# Patient Record
Sex: Female | Born: 1995 | Hispanic: Yes | Marital: Married | Smoking: Never smoker
Health system: Southern US, Community
[De-identification: ages and names within clinical notes are randomized; demographics above are authoritative.]

## PROBLEM LIST (undated history)

## (undated) ENCOUNTER — Inpatient Hospital Stay (HOSPITAL_COMMUNITY): Payer: Self-pay

## (undated) DIAGNOSIS — N390 Urinary tract infection, site not specified: Secondary | ICD-10-CM

## (undated) DIAGNOSIS — Z789 Other specified health status: Secondary | ICD-10-CM

## (undated) HISTORY — PX: NO PAST SURGERIES: SHX2092

---

## 2016-01-21 ENCOUNTER — Encounter (HOSPITAL_COMMUNITY): Payer: Self-pay | Admitting: *Deleted

## 2016-01-21 ENCOUNTER — Inpatient Hospital Stay (HOSPITAL_COMMUNITY): Payer: Medicaid Other

## 2016-01-21 ENCOUNTER — Inpatient Hospital Stay (HOSPITAL_COMMUNITY)
Admission: AD | Admit: 2016-01-21 | Discharge: 2016-01-21 | Disposition: A | Discharge status: Home / Self Care | Payer: Medicaid Other | Source: Ambulatory Visit | Attending: Family Medicine | Admitting: Family Medicine

## 2016-01-21 DIAGNOSIS — O209 Hemorrhage in early pregnancy, unspecified: Secondary | ICD-10-CM

## 2016-01-21 DIAGNOSIS — O3680X0 Pregnancy with inconclusive fetal viability, not applicable or unspecified: Secondary | ICD-10-CM

## 2016-01-21 DIAGNOSIS — Z3A09 9 weeks gestation of pregnancy: Secondary | ICD-10-CM | POA: Diagnosis not present

## 2016-01-21 HISTORY — DX: Other specified health status: Z78.9

## 2016-01-21 LAB — WET PREP, GENITAL
SPERM: NONE SEEN
Trich, Wet Prep: NONE SEEN
Yeast Wet Prep HPF POC: NONE SEEN

## 2016-01-21 LAB — URINALYSIS, ROUTINE W REFLEX MICROSCOPIC
Bilirubin Urine: NEGATIVE
Glucose, UA: NEGATIVE mg/dL
KETONES UR: NEGATIVE mg/dL
LEUKOCYTES UA: NEGATIVE
NITRITE: NEGATIVE
PROTEIN: NEGATIVE mg/dL
Specific Gravity, Urine: 1.01 (ref 1.005–1.030)
pH: 7 (ref 5.0–8.0)

## 2016-01-21 LAB — CBC WITH DIFFERENTIAL/PLATELET
BASOS ABS: 0 10*3/uL (ref 0.0–0.1)
BASOS PCT: 0 %
EOS ABS: 0.1 10*3/uL (ref 0.0–0.7)
EOS PCT: 2 %
HCT: 38.5 % (ref 36.0–46.0)
HEMOGLOBIN: 13.5 g/dL (ref 12.0–15.0)
LYMPHS ABS: 1.7 10*3/uL (ref 0.7–4.0)
Lymphocytes Relative: 26 %
MCH: 30.1 pg (ref 26.0–34.0)
MCHC: 35.1 g/dL (ref 30.0–36.0)
MCV: 85.7 fL (ref 78.0–100.0)
Monocytes Absolute: 0.4 10*3/uL (ref 0.1–1.0)
Monocytes Relative: 7 %
NEUTROS PCT: 65 %
Neutro Abs: 4.2 10*3/uL (ref 1.7–7.7)
PLATELETS: 172 10*3/uL (ref 150–400)
RBC: 4.49 MIL/uL (ref 3.87–5.11)
RDW: 12.7 % (ref 11.5–15.5)
WBC: 6.4 10*3/uL (ref 4.0–10.5)

## 2016-01-21 LAB — URINE MICROSCOPIC-ADD ON: RBC / HPF: NONE SEEN RBC/hpf (ref 0–5)

## 2016-01-21 LAB — POCT PREGNANCY, URINE: PREG TEST UR: POSITIVE — AB

## 2016-01-21 LAB — HCG, QUANTITATIVE, PREGNANCY: HCG, BETA CHAIN, QUANT, S: 18201 m[IU]/mL — AB (ref <5)

## 2016-01-21 MED ORDER — METRONIDAZOLE 500 MG PO TABS: 500.0000 mg | ORAL_TABLET | Freq: Two times a day (BID) | ORAL | Status: DC

## 2016-01-21 NOTE — Discharge Instructions (Signed)
Primer trimestre de embarazo (First Trimester of Pregnancy) El primer trimestre de embarazo se extiende desde la semana1 hasta el final de la semana12 (mes1 al mes3). Durante este tiempo, el beb comenzar a desarrollarse dentro suyo. Entre la semana6 y la8, se forman los ojos y el rostro, y los latidos del corazn pueden verse en la ecografa. Al final de las 12semanas, todos los rganos del beb estn formados. La atencin prenatal es toda la asistencia mdica que usted recibe antes del nacimiento del beb. Asegrese de recibir una buena atencin prenatal y de seguir todas las indicaciones del mdico. CUIDADOS EN EL HOGAR  Medicamentos:  Tome los medicamentos solamente como se lo haya indicado el mdico. Algunos medicamentos se pueden tomar durante el embarazo y otros no.  Tome las vitaminas prenatales como se lo haya indicado el mdico.  Tome el medicamento que la ayuda a defecar (laxante suave) segn sea necesario, si el mdico lo autoriza. Dieta  Ingiera alimentos saludables de manera regular.  El mdico le indicar la cantidad de peso que puede aumentar.  No coma carne cruda ni quesos sin cocinar.  Si tiene malestar estomacal (nuseas) o vomita:  Ingiera 4 o 5comidas pequeas por da en lugar de 3abundantes.  Intente comer algunas galletitas saladas.  Beba lquidos entre las comidas, en lugar de hacerlo durante estas.  Si tiene dificultad para defecar (estreimiento):  Consuma alimentos con alto contenido de fibra, como verduras y frutas frescos, y cereales integrales.  Beba suficiente lquido para mantener el pis (orina) claro o de color amarillo plido. Actividad y ejercicios  Haga ejercicios solamente como se lo haya indicado el mdico. Deje de hacer ejercicios si tiene clicos o dolor en la parte baja del vientre (abdomen) o en la cintura.  Intente no estar de pie durante mucho tiempo. Mueva las piernas con frecuencia si debe estar de pie en un lugar durante  mucho tiempo.  Evite levantar pesos excesivos.  Use zapatos con tacones bajos. Mantenga una buena postura al sentarse y pararse.  Puede tener relaciones sexuales, a menos que el mdico le indique lo contrario. Alivio del dolor o las molestias  Use un sostn que le brinde buen soporte si le duelen las mamas.  Dese baos con agua tibia (baos de asiento) para aliviar el dolor o las molestias a causa de las hemorroides. Use crema antihemorroidal si el mdico se lo permite.  Descanse con las piernas elevadas si tiene calambres o dolor de cintura.  Use medias de descanso si tiene las venas de las piernas hinchadas y abultadas (venas varicosas). Eleve los pies durante 15minutos, 3 o 4veces por da. Limite la cantidad de sal en su dieta. Cuidados prenatales  Programe las visitas prenatales para la semana12 de embarazo.  Escriba sus preguntas. Llvelas cuando concurra a las visitas prenatales.  Concurra a todas las visitas prenatales como se lo haya indicado el mdico. Seguridad  Colquese el cinturn de seguridad cuando conduzca.  Haga una lista con los nmeros de telfono en caso de emergencia, en la cual deben incluirse los nmeros de los familiares, los amigos, el hospital y los departamentos de polica y de bomberos. Consejos generales  Pdale al mdico que la derive a clases prenatales en su localidad. Debe comenzar a tomar las clases antes de entrar en el mes6 de embarazo.  Pida ayuda si necesita asesoramiento o asistencia con la alimentacin. El mdico puede aconsejarla o indicarle dnde recurrir para recibir ayuda.  No se d baos de inmersin en agua caliente,   baños turcos ni saunas. °· No se haga duchas vaginales ni use tampones o toallas higiénicas perfumadas. °· No mantenga las piernas cruzadas durante mucho tiempo. °· Evite el contacto con las bandejas sanitarias de los gatos y la tierra que estos animales usan. °· No fume, no consuma hierbas ni beba alcohol. No tome  fármacos que el médico no haya autorizado. °· No consuma ningún producto que contenga tabaco, lo que incluye cigarrillos, tabaco de mascar o cigarrillos electrónicos. Si necesita ayuda para dejar de fumar, consulte al médico. Puede recibir asesoramiento u otro tipo de apoyo para dejar de fumar. °· Visite al dentista. En su casa, lávese los dientes con un cepillo dental suave. Pásese el hilo dental con suavidad. °SOLICITE AYUDA SI: °· Tiene mareos. °· Tiene cólicos leves o siente presión en la parte baja del vientre. °· Siente un dolor persistente en la zona del vientre. °· Sigue teniendo malestar estomacal, vomita o las heces son líquidas (diarrea). °· Observa una secreción, con mal olor que proviene de la vagina. °· Siente dolor al orinar. °· Tiene el rostro, las manos, las piernas o los tobillos más hinchados (inflamados). °SOLICITE AYUDA DE INMEDIATO SI:  °· Tiene fiebre. °· Tiene una pérdida de líquido por la vagina. °· Tiene sangrado o pequeñas pérdidas vaginales. °· Tiene cólicos o dolor muy intensos en el vientre. °· Sube o baja de peso rápidamente. °· Vomita sangre. Puede ser similar a la borra del café °· Está en contacto con personas que tienen rubéola, la quinta enfermedad o varicela. °· Siente un dolor de cabeza muy intenso. °· Le falta el aire. °· Sufre cualquier tipo de traumatismo, por ejemplo, debido a una caída o un accidente automovilístico. °  °Esta información no tiene como fin reemplazar el consejo del médico. Asegúrese de hacerle al médico cualquier pregunta que tenga. °  °Document Released: 12/12/2008 Document Revised: 10/06/2014 °Elsevier Interactive Patient Education ©2016 [employer] ° °

## 2016-01-21 NOTE — MAU Provider Note (Signed)
History     CSN: [id number]  Arrival date and time: 01/21/16 4098   First Provider Initiated Contact with Patient 01/21/16 2226      Chief Complaint  Patient presents with  . Vaginal Bleeding   HPI [name] is a 20 y.o. G1P0 at [redacted]w[redacted]d who presents to MAU today with complaint of vaginal bleeding. The patient states LMP of 11/13/15 and then spotting with cramping again around mid-March, after she had +HPT. She states this episode of bleeding started on Saturday. It has been light. She has passed 2 small clots. She denies pain, vaginal discharge, fever, UTI symptoms or N/V/D. Last sex was 1.5 weeks ago.   OB History    Gravida Para Term Preterm AB TAB SAB Ectopic Multiple Living   1               Past Medical History  Diagnosis Date  . Medical history non-contributory     History reviewed. No pertinent past surgical history.  History reviewed. No pertinent family history.  Social History  Substance Use Topics  . Smoking status: Never Smoker   . Smokeless tobacco: None  . Alcohol Use: No    Allergies: No Known Allergies  No prescriptions prior to admission    Review of Systems  Constitutional: Negative for fever and malaise/fatigue.  Gastrointestinal: Negative for nausea, vomiting, abdominal pain, diarrhea and constipation.  Genitourinary: Negative for dysuria, urgency and frequency.       + bleeding Neg - discharge   Physical Exam   Blood pressure 114/62, pulse 76, temperature 98.5 F (36.9 C), resp. rate 18, height  (1.651 m), weight 105 lb (47.628 kg), last menstrual period 11/13/2015.  Physical Exam  Nursing note and vitals reviewed. Constitutional: She is oriented to person, place, and time. She appears well-developed and well-nourished. No distress.  HENT:  Head: Normocephalic and atraumatic.  Cardiovascular: Normal rate.   Respiratory: Effort normal.  GI: Soft. She exhibits no distension and no mass. There is no tenderness. There is no  rebound and no guarding.  Neurological: She is alert and oriented to person, place, and time.  Skin: Skin is warm and dry. No erythema.  Psychiatric: She has a normal mood and affect.    Results for orders placed or performed during the hospital encounter of 01/21/16 (from the past 24 hour(s))  ABO/Rh     Status: None (Preliminary result)   Collection Time: 01/21/16  8:34 PM  Result Value Ref Range   ABO/RH(D) B POS   CBC with Differential     Status: None   Collection Time: 01/21/16  8:34 PM  Result Value Ref Range   WBC 6.4 4.0 - 10.5 K/uL   RBC 4.49 3.87 - 5.11 MIL/uL   Hemoglobin 13.5 12.0 - 15.0 g/dL   HCT 11.9 14.7 - 82.9 %   MCV 85.7 78.0 - 100.0 fL   MCH 30.1 26.0 - 34.0 pg   MCHC 35.1 30.0 - 36.0 g/dL   RDW 56.2 13.0 - 86.5 %   Platelets 172 150 - 400 K/uL   Neutrophils Relative % 65 %   Neutro Abs 4.2 1.7 - 7.7 K/uL   Lymphocytes Relative 26 %   Lymphs Abs 1.7 0.7 - 4.0 K/uL   Monocytes Relative 7 %   Monocytes Absolute 0.4 0.1 - 1.0 K/uL   Eosinophils Relative 2 %   Eosinophils Absolute 0.1 0.0 - 0.7 K/uL   Basophils Relative 0 %   Basophils Absolute 0.0  0.0 - 0.1 K/uL  hCG, quantitative, pregnancy     Status: Abnormal   Collection Time: 01/21/16  8:34 PM  Result Value Ref Range   hCG, Beta Chain, Quant, S 18201 (H) <5 mIU/mL  Urinalysis, Routine w reflex microscopic ([facility])     Status: Abnormal   Collection Time: 01/21/16  8:45 PM  Result Value Ref Range   Color, Urine YELLOW YELLOW   APPearance CLEAR CLEAR   Specific Gravity, Urine 1.010 1.005 - 1.030   pH 7.0 5.0 - 8.0   Glucose, UA NEGATIVE NEGATIVE mg/dL   Hgb urine dipstick SMALL (A) NEGATIVE   Bilirubin Urine NEGATIVE NEGATIVE   Ketones, ur NEGATIVE NEGATIVE mg/dL   Protein, ur NEGATIVE NEGATIVE mg/dL   Nitrite NEGATIVE NEGATIVE   Leukocytes, UA NEGATIVE NEGATIVE  Urine microscopic-add on     Status: Abnormal   Collection Time: 01/21/16  8:45 PM  Result Value Ref Range   Squamous  Epithelial / LPF 0-5 (A) NONE SEEN   WBC, UA 0-5 0 - 5 WBC/hpf   RBC / HPF NONE SEEN 0 - 5 RBC/hpf   Bacteria, UA RARE (A) NONE SEEN   Urine-Other MUCOUS PRESENT   Pregnancy, urine POC     Status: Abnormal   Collection Time: 01/21/16  9:19 PM  Result Value Ref Range   Preg Test, Ur POSITIVE (A) NEGATIVE  Wet prep, genital     Status: Abnormal   Collection Time: 01/21/16 10:35 PM  Result Value Ref Range   Yeast Wet Prep HPF POC NONE SEEN NONE SEEN   Trich, Wet Prep NONE SEEN NONE SEEN   Clue Cells Wet Prep HPF POC PRESENT (A) NONE SEEN   WBC, Wet Prep HPF POC FEW (A) NONE SEEN   Sperm NONE SEEN    [place] Ob Comp Less 14 Wks  01/21/2016  CLINICAL DATA:  Acute onset of vaginal bleeding.  Initial encounter. EXAM: OBSTETRIC <14 WK US AND TRANSVAGINAL OB US TECHNIQUE: Both transabdominal and transvaginal ultrasound examinations were performed for complete evaluation of the gestation as well as the maternal uterus, adnexal regions, and pelvic cul-de-sac. Transvaginal technique was performed to assess early pregnancy. COMPARISON:  None. FINDINGS: Intrauterine gestational sac: There is an elongated collection of fluid at the endometrial echo complex, which could reflect a gestational sac or possibly simply a small amount of fluid. Yolk sac:  No Embryo:  No MSD: 9.3  mm   5 w   5  d Subchorionic hemorrhage:  None visualized. Maternal uterus/adnexae: The uterus is otherwise unremarkable in appearance. The right ovary is not visualized due to overlying bowel gas. The left ovary is unremarkable in appearance, measuring 2.4 x 1.6 x 0.8 cm. There is no evidence for ectopic pregnancy. No free fluid is within the pelvic cul-de-sac. IMPRESSION: Question of intrauterine gestational sac measuring 9 mm in mean sac diameter, though this could simply reflect a small amount of fluid within the endometrial echo complex. This would correspond to a gestational age of [redacted] weeks 5 days, which does not match the gestational age by  LMP. It remains too early to determine an estimated date of delivery. If the patient's quantitative beta HCG level continues to trend upward, follow-up pelvic ultrasound could be performed in 2 weeks for further evaluation. Electronically Signed   By: Roanna RaiderJeffery  Chang M.D.   On: 01/21/2016 22:25   [place] Ob Transvaginal  01/21/2016  CLINICAL DATA:  Acute onset of vaginal bleeding.  Initial encounter. EXAM: OBSTETRIC <  14 WK [place] AND TRANSVAGINAL OB US TECHNIQUE: Both transabdominal and transvaginal ultrasound examinations were performed for complete evaluation of the gestation as well as the maternal uterus, adnexal regions, and pelvic cul-de-sac. Transvaginal technique was performed to assess early pregnancy. COMPARISON:  None. FINDINGS: Intrauterine gestational sac: There is an elongated collection of fluid at the endometrial echo complex, which could reflect a gestational sac or possibly simply a small amount of fluid. Yolk sac:  No Embryo:  No MSD: 9.3  mm   5 w   5  d Subchorionic hemorrhage:  None visualized. Maternal uterus/adnexae: The uterus is otherwise unremarkable in appearance. The right ovary is not visualized due to overlying bowel gas. The left ovary is unremarkable in appearance, measuring 2.4 x 1.6 x 0.8 cm. There is no evidence for ectopic pregnancy. No free fluid is within the pelvic cul-de-sac. IMPRESSION: Question of intrauterine gestational sac measuring 9 mm in mean sac diameter, though this could simply reflect a small amount of fluid within the endometrial echo complex. This would correspond to a gestational age of [redacted] weeks 5 days, which does not match the gestational age by LMP. It remains too early to determine an estimated date of delivery. If the patient's quantitative beta HCG level continues to trend upward, follow-up pelvic ultrasound could be performed in 2 weeks for further evaluation. Electronically Signed   By: Roanna Raider M.D.   On: 01/21/2016 22:25    MAU Course   Procedures None  MDM +UPT UA, wet prep, GC/chlamydia, CBC, ABO/Rh, quant hCG, HIV, RPR and [place] today to rule out ectopic pregnancy Discussed patient with Dr. Adrian Blackwater. Since patient is not having pain and only light bleeding today will have patient follow-up in 48 hours in [facility] for repeat labs with strict ectopic precautions.  Assessment and Plan  A: Pregnancy of unknown location Vaginal bleeding in pregnancy  P: Discharge home Ectopic/bleeding precautions discussed Patient advised to follow-up with [facility] on Thursday at 11:00 am Patient may return to MAU as needed or if her condition were to change or worsen   Marny Lowenstein, PA-C  01/21/2016, 11:19 PM

## 2016-01-21 NOTE — MAU Note (Signed)
Pt [facility]PT [facility] wks ago when she had brown spotting. Today she started with red bleeding and two very sm clots followed by some mild upper abd pain. Pt dizzy while sitting in triage.

## 2016-01-22 ENCOUNTER — Telehealth: Payer: Self-pay | Admitting: *Deleted

## 2016-01-22 LAB — GC/CHLAMYDIA PROBE AMP (~~LOC~~) NOT AT ARMC
Chlamydia: NEGATIVE
NEISSERIA GONORRHEA: NEGATIVE

## 2016-01-22 LAB — ABO/RH: ABO/RH(D): B POS

## 2016-01-22 MED ORDER — METRONIDAZOLE 500 MG PO TABS: 500.0000 mg | ORAL_TABLET | Freq: Two times a day (BID) | ORAL | Status: DC

## 2016-01-22 NOTE — Telephone Encounter (Signed)
Pt didn't get rx for flagyl. Sent to pharmacy.

## 2016-01-24 ENCOUNTER — Other Ambulatory Visit: Payer: Self-pay

## 2016-01-24 DIAGNOSIS — O3680X0 Pregnancy with inconclusive fetal viability, not applicable or unspecified: Secondary | ICD-10-CM

## 2016-01-24 NOTE — Progress Notes (Unsigned)
Pt in for repeat hcg level. Denies pain, has a small amount of bleeding. Dr. Macon LargeAnyanwu reviewed ultrasound and labs and stated that patient doesn't repeat hcg only repeat ultrasound next week. Patient is agreeable to this.

## 2016-01-25 ENCOUNTER — Inpatient Hospital Stay (HOSPITAL_COMMUNITY): Payer: Medicaid Other

## 2016-01-25 ENCOUNTER — Encounter (HOSPITAL_COMMUNITY): Payer: Self-pay

## 2016-01-25 ENCOUNTER — Inpatient Hospital Stay (HOSPITAL_COMMUNITY)
Admission: AD | Admit: 2016-01-25 | Discharge: 2016-01-26 | Disposition: A | Discharge status: Home / Self Care | Payer: Medicaid Other | Source: Ambulatory Visit | Attending: Obstetrics and Gynecology | Admitting: Obstetrics and Gynecology

## 2016-01-25 DIAGNOSIS — Z3A10 10 weeks gestation of pregnancy: Secondary | ICD-10-CM | POA: Diagnosis not present

## 2016-01-25 DIAGNOSIS — R1084 Generalized abdominal pain: Secondary | ICD-10-CM | POA: Diagnosis present

## 2016-01-25 DIAGNOSIS — O039 Complete or unspecified spontaneous abortion without complication: Secondary | ICD-10-CM | POA: Diagnosis not present

## 2016-01-25 DIAGNOSIS — O209 Hemorrhage in early pregnancy, unspecified: Secondary | ICD-10-CM | POA: Diagnosis not present

## 2016-01-25 DIAGNOSIS — O034 Incomplete spontaneous abortion without complication: Secondary | ICD-10-CM

## 2016-01-25 DIAGNOSIS — N939 Abnormal uterine and vaginal bleeding, unspecified: Secondary | ICD-10-CM | POA: Diagnosis present

## 2016-01-25 LAB — CBC
HCT: 36.1 % (ref 36.0–46.0)
HEMOGLOBIN: 12.8 g/dL (ref 12.0–15.0)
MCH: 30.1 pg (ref 26.0–34.0)
MCHC: 35.5 g/dL (ref 30.0–36.0)
MCV: 84.9 fL (ref 78.0–100.0)
PLATELETS: 149 10*3/uL — AB (ref 150–400)
RBC: 4.25 MIL/uL (ref 3.87–5.11)
RDW: 12.6 % (ref 11.5–15.5)
WBC: 7.5 10*3/uL (ref 4.0–10.5)

## 2016-01-25 NOTE — MAU Provider Note (Signed)
History     CSN: [id number]  Arrival date and time: 01/25/16 2243   None     Chief Complaint  Patient presents with  . Vaginal Bleeding  . Abdominal Pain   HPI   [name] is a 20 y.o. female G1P0 at 5917w3d presenting with vaginal bleeding and abdominal pain. The bleeding started a few days ago and she was seen for this in MAU. She was scheduled to go for repeat blood work on Thursday however when she arrived they told her they were going to change her appointment to next Wednesday to have an US.  The pain started having pain in her lower abdomen today and heavier bleeding, so the patient returned to MAU.  She rates her pain 9/10, she has not taken anything for the pain.   OB History    Gravida Para Term Preterm AB TAB SAB Ectopic Multiple Living   1               Past Medical History  Diagnosis Date  . Medical history non-contributory     History reviewed. No pertinent past surgical history.  History reviewed. No pertinent family history.  Social History  Substance Use Topics  . Smoking status: Never Smoker   . Smokeless tobacco: None  . Alcohol Use: No    Allergies: No Known Allergies  Prescriptions prior to admission  Medication Sig Dispense Refill Last Dose  . metroNIDAZOLE (FLAGYL) 500 MG tablet Take 1 tablet (500 mg total) by mouth 2 (two) times daily. 14 tablet 0   . prenatal vitamin w/FE, FA (PRENATAL 1 + 1) 27-1 MG TABS tablet Take 1 tablet by mouth daily at 12 noon.   01/21/2016 at Unknown time   Results for orders placed or performed during the hospital encounter of 01/25/16 (from the past 48 hour(s))  hCG, quantitative, pregnancy     Status: Abnormal   Collection Time: 01/25/16 11:34 PM  Result Value Ref Range   hCG, Beta Chain, Quant, S 9272 (H) <5 mIU/mL    Comment:          GEST. AGE      CONC.  (mIU/mL)   <=1 WEEK        5 - 50     2 WEEKS       50 - 500     3 WEEKS       100 - 10,000     4 WEEKS     1,000 - 30,000     5 WEEKS      3,500 - 115,000   6-8 WEEKS     12,000 - 270,000    12 WEEKS     15,000 - 220,000        FEMALE AND NON-PREGNANT FEMALE:     LESS THAN 5 mIU/mL   CBC     Status: Abnormal   Collection Time: 01/25/16 11:34 PM  Result Value Ref Range   WBC 7.5 4.0 - 10.5 K/uL   RBC 4.25 3.87 - 5.11 MIL/uL   Hemoglobin 12.8 12.0 - 15.0 g/dL   HCT 47.836.1 29.536.0 - 62.146.0 %   MCV 84.9 78.0 - 100.0 fL   MCH 30.1 26.0 - 34.0 pg   MCHC 35.5 30.0 - 36.0 g/dL   RDW 30.812.6 65.711.5 - 84.615.5 %   Platelets 149 (L) 150 - 400 K/uL   [place] Ob Transvaginal  01/26/2016  CLINICAL DATA:  Acute onset of vaginal bleeding and generalized abdominal pain. Initial encounter.  EXAM: TRANSVAGINAL OB ULTRASOUND TECHNIQUE: Transvaginal ultrasound was performed for complete evaluation of the gestation as well as the maternal uterus, adnexal regions, and pelvic cul-de-sac. COMPARISON:  None. FINDINGS: Intrauterine gestational sac: Seen at the lower uterine segment, elongated and irregular in appearance. Yolk sac:  No Embryo:  No MSD: 11.2  mm   5 w   6  d Subchorionic hemorrhage:  None visualized. Maternal uterus/adnexae: The uterus is otherwise unremarkable. The ovaries are within normal limits. The right ovary measures 2.7 x 1.9 x 2.4 cm, while the left ovary measures 1.9 x 0.7 x 1.4 cm. No suspicious adnexal masses are seen; there is no evidence for ovarian torsion. No free fluid is seen within the pelvic cul-de-sac. IMPRESSION: The patient's intrauterine gestational sac is elongated and irregular, noted at the lower uterine segment, concerning for spontaneous abortion in progress. Electronically Signed   By: Roanna Raider M.D.   On: 01/26/2016 00:10     Review of Systems  Constitutional: Negative for fever and chills.  Gastrointestinal: Positive for abdominal pain. Negative for nausea and vomiting.  Genitourinary: Negative for dysuria and urgency.   Physical Exam   Blood pressure 132/66, pulse 92, temperature 97.4 F (36.3 C), resp. rate 20,  height  (1.651 m), weight 103 lb 12.8 oz (47.083 kg), last menstrual period 11/13/2015.  Physical Exam  Constitutional: She is oriented to person, place, and time. She appears well-developed and well-nourished. No distress.  Respiratory: Effort normal.  GI: Soft. She exhibits no distension. There is no tenderness. There is no rebound and no guarding.  Genitourinary:  Bimanual exam: Cervix closed, anterior, small amount of dark red blood noted on exam glove.   Musculoskeletal: Normal range of motion.  Neurological: She is alert and oriented to person, place, and time.  Skin: Skin is warm. She is not diaphoretic.  Psychiatric: Her behavior is normal.    MAU Course  Procedures  None  MDM Toradol 60 mg IM Vicodin 1 tab CBC Hcg [place]   B positive blood type. Patient and significant very upset with diagnoses of impending SAB. Spanish interpretor used.   Assessment and Plan   A:  1. Inevitable spontaneous abortion   2. Vaginal bleeding in pregnancy, first trimester    P:  Discharge home in stable condition RX: Vicodin, ibuprofen Follow up in the [facility] in 6-7 days; message sent to the Clinic Bleeding precautions Pelvic rest Return to MAU if symptoms worsen   Duane Lope, NP 01/25/2016 1:13 AM

## 2016-01-25 NOTE — MAU Note (Signed)
Spotting since last Sunday. Seen MAU on Monday. Has cont to spot. Tonight having abd cramping and more bleeding

## 2016-01-26 ENCOUNTER — Encounter (HOSPITAL_COMMUNITY): Payer: Self-pay | Admitting: Obstetrics and Gynecology

## 2016-01-26 DIAGNOSIS — O039 Complete or unspecified spontaneous abortion without complication: Secondary | ICD-10-CM

## 2016-01-26 LAB — HCG, QUANTITATIVE, PREGNANCY: hCG, Beta Chain, Quant, S: 9272 m[IU]/mL — ABNORMAL HIGH (ref <5)

## 2016-01-26 MED ORDER — HYDROCODONE-ACETAMINOPHEN 5-325 MG PO TABS: 1.0000 | ORAL_TABLET | ORAL | Status: DC | PRN

## 2016-01-26 MED ORDER — KETOROLAC TROMETHAMINE 60 MG/2ML IM SOLN
60.0000 mg | Freq: Once | INTRAMUSCULAR | Status: AC
  Administered 2016-01-26: 60 mg via INTRAMUSCULAR
  Filled 2016-01-26: qty 2

## 2016-01-26 MED ORDER — IBUPROFEN 600 MG PO TABS: 600.0000 mg | ORAL_TABLET | Freq: Four times a day (QID) | ORAL | Status: DC | PRN

## 2016-01-26 MED ORDER — HYDROCODONE-ACETAMINOPHEN 5-325 MG PO TABS
1.0000 | ORAL_TABLET | Freq: Once | ORAL | Status: AC
  Administered 2016-01-26: 1 via ORAL
  Filled 2016-01-26: qty 1

## 2016-01-26 NOTE — Discharge Instructions (Signed)
Vaginal Bleeding During Pregnancy, First Trimester °A small amount of bleeding (spotting) from the vagina is common in early pregnancy. Sometimes the bleeding is normal and is not a problem, and sometimes it is a sign of something serious. Be sure to tell your doctor about any bleeding from your vagina right away. °HOME CARE °· Watch your condition for any changes. °· Follow your doctor's instructions about how active you can be. °· If you are on bed rest: °· You may need to stay in bed and only get up to use the bathroom. °· You may be allowed to do some activities. °· If you need help, make plans for someone to help you. °· Write down: °· The number of pads you use each day. °· How often you change pads. °· How soaked (saturated) your pads are. °· Do not use tampons. °· Do not douche. °· Do not have sex or orgasms until your doctor says it is okay. °· If you pass any tissue from your vagina, save the tissue so you can show it to your doctor. °· Only take medicines as told by your doctor. °· Do not take aspirin because it can make you bleed. °· Keep all follow-up visits as told by your doctor. °GET HELP IF:  °· You bleed from your vagina. °· You have cramps. °· You have labor pains. °· You have a fever that does not go away after you take medicine. °GET HELP RIGHT AWAY IF:  °· You have very bad cramps in your back or belly (abdomen). °· You pass large clots or tissue from your vagina. °· You bleed more. °· You feel light-headed or weak. °· You pass out (faint). °· You have chills. °· You are leaking fluid or have a gush of fluid from your vagina. °· You pass out while pooping (having a bowel movement). °MAKE SURE YOU: °· Understand these instructions. °· Will watch your condition. °· Will get help right away if you are not doing well or get worse. °  °This information is not intended to replace advice given to you by your health care provider. Make sure you discuss any questions you have with your health care  provider. °  °Document Released: 01/30/2014 Document Reviewed: 01/30/2014 °Elsevier Interactive Patient Education ©2016 Elsevier Inc. ° °Miscarriage °A miscarriage is the sudden loss of an unborn baby (fetus) before the 20th week of pregnancy. Most miscarriages happen in the first 3 months of pregnancy. Sometimes, it happens before a woman even knows she is pregnant. A miscarriage is also called a "spontaneous miscarriage" or "early pregnancy loss." Having a miscarriage can be an emotional experience. Talk with your caregiver about any questions you may have about miscarrying, the grieving process, and your future pregnancy plans. °CAUSES  °· Problems with the fetal chromosomes that make it impossible for the baby to develop normally. Problems with the baby's genes or chromosomes are most often the result of errors that occur, by chance, as the embryo divides and grows. The problems are not inherited from the parents. °· Infection of the cervix or uterus.   °· Hormone problems.   °· Problems with the cervix, such as having an incompetent cervix. This is when the tissue in the cervix is not strong enough to hold the pregnancy.   °· Problems with the uterus, such as an abnormally shaped uterus, uterine fibroids, or congenital abnormalities.   °· Certain medical conditions.   °· Smoking, drinking alcohol, or taking illegal drugs.   °· Trauma.   °Often, the cause   of a miscarriage is unknown.  °SYMPTOMS  °· Vaginal bleeding or spotting, with or without cramps or pain. °· Pain or cramping in the abdomen or lower back. °· Passing fluid, tissue, or blood clots from the vagina. °DIAGNOSIS  °Your caregiver will perform a physical exam. You may also have an ultrasound to confirm the miscarriage. Blood or urine tests may also be ordered. °TREATMENT  °· Sometimes, treatment is not necessary if you naturally pass all the fetal tissue that was in the uterus. If some of the fetus or placenta remains in the body (incomplete  miscarriage), tissue left behind may become infected and must be removed. Usually, a dilation and curettage (D and C) procedure is performed. During a D and C procedure, the cervix is widened (dilated) and any remaining fetal or placental tissue is gently removed from the uterus. °· Antibiotic medicines are prescribed if there is an infection. Other medicines may be given to reduce the size of the uterus (contract) if there is a lot of bleeding. °· If you have Rh negative blood and your baby was Rh positive, you will need a Rh immunoglobulin shot. This shot will protect any future baby from having Rh blood problems in future pregnancies. °HOME CARE INSTRUCTIONS  °· Your caregiver may order bed rest or may allow you to continue light activity. Resume activity as directed by your caregiver. °· Have someone help with home and family responsibilities during this time.   °· Keep track of the number of sanitary pads you use each day and how soaked (saturated) they are. Write down this information.   °· Do not use tampons. Do not douche or have sexual intercourse until approved by your caregiver.   °· Only take over-the-counter or prescription medicines for pain or discomfort as directed by your caregiver.   °· Do not take aspirin. Aspirin can cause bleeding.   °· Keep all follow-up appointments with your caregiver.   °· If you or your partner have problems with grieving, talk to your caregiver or seek counseling to help cope with the pregnancy loss. Allow enough time to grieve before trying to get pregnant again.   °SEEK IMMEDIATE MEDICAL CARE IF:  °· You have severe cramps or pain in your back or abdomen. °· You have a fever. °· You pass large blood clots (walnut-sized or larger) or tissue from your vagina. Save any tissue for your caregiver to inspect.   °· Your bleeding increases.   °· You have a thick, bad-smelling vaginal discharge. °· You become lightheaded, weak, or you faint.   °· You have chills.   °MAKE SURE  YOU: °· Understand these instructions. °· Will watch your condition. °· Will get help right away if you are not doing well or get worse. °  °This information is not intended to replace advice given to you by your health care provider. Make sure you discuss any questions you have with your health care provider. °  °Document Released: 03/11/2001 Document Revised: 01/10/2013 Document Reviewed: 11/04/2011 °Elsevier Interactive Patient Education ©2016 Elsevier Inc. ° °

## 2016-01-26 NOTE — MAU Note (Signed)
Urine collected and sent to lab.

## 2016-01-30 ENCOUNTER — Ambulatory Visit (HOSPITAL_COMMUNITY): Admission: RE | Admit: 2016-01-30 | Payer: Self-pay | Source: Ambulatory Visit

## 2016-02-07 ENCOUNTER — Other Ambulatory Visit: Payer: Self-pay | Admitting: Advanced Practice Midwife

## 2016-02-07 ENCOUNTER — Ambulatory Visit (INDEPENDENT_AMBULATORY_CARE_PROVIDER_SITE_OTHER): Payer: Medicaid Other | Admitting: Advanced Practice Midwife

## 2016-02-07 ENCOUNTER — Encounter: Payer: Self-pay | Admitting: Advanced Practice Midwife

## 2016-02-07 VITALS — BP 121/72 | HR 67 | Temp 97.9°F | Ht 65.0 in | Wt 102.2 lb

## 2016-02-07 DIAGNOSIS — O039 Complete or unspecified spontaneous abortion without complication: Secondary | ICD-10-CM

## 2016-02-07 NOTE — Progress Notes (Signed)
Used Interpreter Darletta Mollorita Arias. Did not complete flagyl due to miscarriage.

## 2016-02-07 NOTE — Progress Notes (Signed)
Subjective:    Emily Finley is a 20 y.o. female. [place] reports She had a spontaneous miscarriage on 01/25/16,  She is not in acute distress. Ectopic risks: none.  Cycle length: regular .  Blood type: B positive. Other lab results: none.  The following portions of the patient's history were reviewed and updated as appropriate: allergies, current medications, past family history, past medical history, past social history, past surgical history and problem list.  Review of Systems Pertinent items are noted in HPI.   Objective:     BP 121/72 mmHg  Pulse 67  Temp(Src) 97.9 F (36.6 C)  Ht  (1.651 m)  Wt 102 lb 3.2 oz (46.358 kg)  BMI 17.01 kg/m2  LMP 11/13/2015 (Exact Date)  Breastfeeding? Unknown General:   alert and cooperative  Heart: regular rate and rhythm, S1, S2 normal, no murmur, click, rub or gallop  Lungs: clear to auscultation bilaterally  Abdomen: soft, non-tender, without masses or organomegaly  Pelvic: Vulva and vagina appear normal. Bimanual exam reveals normal uterus and adnexa.                 Vulva: Bartholin's, Urethra, Skene's normal              Vagina:  normal mucosa, normal discharge, No further bleeding               Cervix: nulliparous appearance               Uterus: normal size              Adnexa: no mass, fullness, tenderness   Imaging    US Ob Comp Less 14 Wks  01/21/2016  CLINICAL DATA:  Acute onset of vaginal bleeding.  Initial encounter. EXAM: OBSTETRIC <14 WK [place] AND TRANSVAGINAL OB US TECHNIQUE: Both transabdominal and transvaginal ultrasound examinations were performed for complete evaluation of the gestation as well as the maternal uterus, adnexal regions, and pelvic cul-de-sac. Transvaginal technique was performed to assess early pregnancy. COMPARISON:  None. FINDINGS: Intrauterine gestational sac: There is an elongated collection of fluid at the endometrial echo complex, which could reflect a gestational sac or possibly simply a  small amount of fluid. Yolk sac:  No Embryo:  No MSD: 9.3  mm   5 w   5  d Subchorionic hemorrhage:  None visualized. Maternal uterus/adnexae: The uterus is otherwise unremarkable in appearance. The right ovary is not visualized due to overlying bowel gas. The left ovary is unremarkable in appearance, measuring 2.4 x 1.6 x 0.8 cm. There is no evidence for ectopic pregnancy. No free fluid is within the pelvic cul-de-sac. IMPRESSION: Question of intrauterine gestational sac measuring 9 mm in mean sac diameter, though this could simply reflect a small amount of fluid within the endometrial echo complex. This would correspond to a gestational age of [redacted] weeks 5 days, which does not match the gestational age by LMP. It remains too early to determine an estimated date of delivery. If the patient's quantitative beta HCG level continues to trend upward, follow-up pelvic ultrasound could be performed in 2 weeks for further evaluation. Electronically Signed   By: Roanna Raider M.D.   On: 01/21/2016 22:25   US Ob Transvaginal  01/26/2016  CLINICAL DATA:  Acute onset of vaginal bleeding and generalized abdominal pain. Initial encounter. EXAM: TRANSVAGINAL OB ULTRASOUND TECHNIQUE: Transvaginal ultrasound was performed for complete evaluation of the gestation as well as the maternal uterus, adnexal regions, and pelvic cul-de-sac. COMPARISON:  None.  FINDINGS: Intrauterine gestational sac: Seen at the lower uterine segment, elongated and irregular in appearance. Yolk sac:  No Embryo:  No MSD: 11.2  mm   5 w   6  d Subchorionic hemorrhage:  None visualized. Maternal uterus/adnexae: The uterus is otherwise unremarkable. The ovaries are within normal limits. The right ovary measures 2.7 x 1.9 x 2.4 cm, while the left ovary measures 1.9 x 0.7 x 1.4 cm. No suspicious adnexal masses are seen; there is no evidence for ovarian torsion. No free fluid is seen within the pelvic cul-de-sac. IMPRESSION: The patient's intrauterine gestational  sac is elongated and irregular, noted at the lower uterine segment, concerning for spontaneous abortion in progress. Electronically Signed   By: Roanna RaiderJeffery  Chang M.D.   On: 01/26/2016 00:10   [place] Ob Transvaginal  01/21/2016  CLINICAL DATA:  Acute onset of vaginal bleeding.  Initial encounter. EXAM: OBSTETRIC <14 WK US AND TRANSVAGINAL OB US TECHNIQUE: Both transabdominal and transvaginal ultrasound examinations were performed for complete evaluation of the gestation as well as the maternal uterus, adnexal regions, and pelvic cul-de-sac. Transvaginal technique was performed to assess early pregnancy. COMPARISON:  None. FINDINGS: Intrauterine gestational sac: There is an elongated collection of fluid at the endometrial echo complex, which could reflect a gestational sac or possibly simply a small amount of fluid. Yolk sac:  No Embryo:  No MSD: 9.3  mm   5 w   5  d Subchorionic hemorrhage:  None visualized. Maternal uterus/adnexae: The uterus is otherwise unremarkable in appearance. The right ovary is not visualized due to overlying bowel gas. The left ovary is unremarkable in appearance, measuring 2.4 x 1.6 x 0.8 cm. There is no evidence for ectopic pregnancy. No free fluid is within the pelvic cul-de-sac. IMPRESSION: Question of intrauterine gestational sac measuring 9 mm in mean sac diameter, though this could simply reflect a small amount of fluid within the endometrial echo complex. This would correspond to a gestational age of [redacted] weeks 5 days, which does not match the gestational age by LMP. It remains too early to determine an estimated date of delivery. If the patient's quantitative beta HCG level continues to trend upward, follow-up pelvic ultrasound could be performed in 2 weeks for further evaluation. Electronically Signed   By: Roanna RaiderJeffery  Chang M.D.   On: 01/21/2016 22:25    Assessment:       S/P Spontaneous abortion on 01/26/16     Plan:     Discussed SAB   Doing well with no further bleeding or  pain   Will check quant HCG today and weekly until 0    Discussed future plans.  Does not plan pregnancy but declines contraception. States her husband "will take care of it". Discussed this method is not always successful and may result in pregnancy. She states that is all right    Interpretor used

## 2016-02-07 NOTE — Patient Instructions (Signed)
Aborto espontáneo  °(Miscarriage) °El aborto espontáneo es la pérdida de un bebé que no ha nacido (feto) antes de la semana 20 del embarazo. La mayor parte de estos abortos ocurre en los primeros 3 meses. En algunos casos ocurre antes de que la mujer sepa que está embarazada. También se denomina "aborto espontáneo" o "pérdida prematura del embarazo". El aborto espontáneo puede ser una experiencia que afecte emocionalmente a la persona. Converse con su médico si tiene dudas, cómo es el proceso de duelo, y sobre planes futuros de embarazo.  °CAUSAS  °· Algunos problemas cromosómicos pueden hacer imposible que el bebé se desarrolle normalmente. Los problemas con los genes o cromosomas del bebé son generalmente el resultado de errores que se producen, por casualidad, cuando el embrión se divide y crece. Estos problemas no se heredan de los padres. °· Infección en el cuello del útero.   °· Problemas hormonales.   °· Problemas en el cuello del útero, como tener un útero incompetente. Esto ocurre cuando los tejidos no son lo suficientemente fuertes como para contener el embarazo.   °· Problemas del útero, como un útero con forma anormal, los fibromas o anormalidades congénitas.   °· Ciertas enfermedades crónicas.   °· No fume, no beba alcohol, ni consuma drogas.   °· Traumatismos   °A veces, la causa es desconocida.  °SÍNTOMAS  °· Sangrado o manchado vaginal, con o sin cólicos o dolor. °· Dolor o cólicos en el abdomen o en la cintura. °· Eliminación de líquido, tejidos o coágulos grandes por la vagina. °DIAGNÓSTICO  °El médico le hará un examen físico. También le indicará una ecografía para confirmar el aborto. Es posible que se realicen análisis de sangre.  °TRATAMIENTO  °· En algunos casos el tratamiento no es necesario, si se eliminan naturalmente todos los tejidos embrionarios que se encontraban en el útero. Si el feto o la placenta quedan dentro del útero (aborto incompleto), pueden infectarse, los tejidos que quedan  pueden infectarse y deben retirarse. Generalmente se realiza un procedimiento de dilatación y curetaje (D y C). Durante el procedimiento de dilatación y curetaje, el cuello del útero se abre (dilata) y se retira cualquier resto de tejido fetal o placentario del útero. °· Si hay una infección, le recetarán antibióticos. Podrán recetarle otros medicamentos para reducir el tamaño del útero (contraerlo) si hay una mucho sangrado. °· Si su sangre es Rh negativa y su bebé es Rh positivo, usted necesitará la inyección de inmunoglobulina Rh. Esta inyección protegerá a los futuros bebés de tener problemas de compatibilidad Rh en futuros embarazos. °INSTRUCCIONES PARA EL CUIDADO EN EL HOGAR  °· El médico le indicará reposo en cama o le permitirá realizar actividades livianas. Vuelva a la actividad lentamente o según las indicaciones de su médico. °· Pídale a alguien que la ayude con las responsabilidades familiares y del hogar durante este tiempo.   °· Lleve un registro de la cantidad y la saturación de las toallas higiénicas que utiliza cada día. Anote esta información   °· No use tampones. No No se haga duchas vaginales ni tenga relaciones sexuales hasta que el médico la autorice.   °· Sólo tome medicamentos de venta libre o recetados para calmar el dolor o el malestar, según las indicaciones de su médico.   °· No tome aspirina. La aspirina puede ocasionar hemorragias.   °· Concurra puntualmente a las citas de control con el médico.   °· Si usted o su pareja tienen dificultades con el duelo, hable con su médico para buscar la ayuda psicológica que los ayude a enfrentar la pérdida   del embarazo. Permítase el tiempo suficiente de duelo antes de quedar embarazada nuevamente.   °SOLICITE ATENCIÓN MÉDICA DE INMEDIATO SI:  °· Siente calambres intensos o dolor en la espalda o en el abdomen. °· Tiene fiebre. °· Elimina grandes coágulos de sangre (del tamaño de una nuez o más) o tejidos por la vagina. Guarde lo que ha eliminado para  que su médico lo examine.   °· La hemorragia aumenta.   °· Observa una secreción vaginal espesa y con mal olor. °· Se siente mareada, débil, o se desmaya.   °· Siente escalofríos.   °ASEGÚRESE DE QUE:  °· Comprende estas instrucciones. °· Controlará su enfermedad. °· Solicitará ayuda de inmediato si no mejora o si empeora. °  °Esta información no tiene como fin reemplazar el consejo del médico. Asegúrese de hacerle al médico cualquier pregunta que tenga. °  °Document Released: 06/25/2005 Document Revised: 01/10/2013 °Elsevier Interactive Patient Education ©2016 Elsevier Inc. ° °

## 2016-02-08 LAB — HCG, QUANTITATIVE, PREGNANCY: hCG, Beta Chain, Quant, S: 82.1 m[IU]/mL — ABNORMAL HIGH

## 2016-02-11 ENCOUNTER — Telehealth: Payer: Self-pay | Admitting: General Practice

## 2016-02-11 NOTE — Telephone Encounter (Signed)
Telephone call to patient regarding bhcg results. Patient should return Friday or early next week for repeat bhcg following SAB. Called patient & her husband answered stating she wasn't in right now but would tell her we called.

## 2016-02-12 LAB — BETA HCG QUANT (REF LAB): BETA HCG, TUMOR MARKER: 115.6 m[IU]/mL — AB (ref <5.0)

## 2016-02-20 NOTE — Telephone Encounter (Signed)
Called patient with Darl PikesSusan for interpreter & informed her of results & recommendations. Patient verbalized understanding & states she can come 5/30 @ 11am. Patient had no questions

## 2016-02-26 ENCOUNTER — Other Ambulatory Visit: Payer: Self-pay

## 2016-02-26 DIAGNOSIS — O039 Complete or unspecified spontaneous abortion without complication: Secondary | ICD-10-CM

## 2016-02-26 LAB — HCG, QUANTITATIVE, PREGNANCY: hCG, Beta Chain, Quant, S: 6.6 m[IU]/mL — ABNORMAL HIGH

## 2016-02-27 ENCOUNTER — Telehealth: Payer: Self-pay | Admitting: General Practice

## 2016-02-27 NOTE — Telephone Encounter (Signed)
Called patient with bhcg results with pac[place] interpreter [phone]. Patient verbalized understanding and states she started bleeding a week ago like a period and is still bleeding. Reassured patient that it is likely her body still processing what is left of the pregnancy and that bleeding should improve soon. Patient verbalized understanding & had no questions

## 2016-09-29 NOTE — L&D Delivery Note (Signed)
Delivery Note After a 1 hour second stage, at 9:31 AM a viable female was delivered via  (Presentation:LOA;  ).  APGAR: 9/9, ; weight  Pending.  After 1 minute, the cord was clamped and cut. 40 units of pitocin diluted in 1000cc LR was infused rapidly IV.  The placenta separated spontaneously and delivered via CCT and maternal pushing effort.  It was inspected and appears to be intact with a 3 VC.   Anesthesia:  local Episiotomy:  none Lacerations:  Tiny 1st degree perineal Suture Repair: 3.0 vicryl Est. Blood Loss (mL):  200 Mom to postpartum.  Baby to Couplet care / Skin to Skin.  CRESENZO-DISHMAN,Arvine Clayburn 03/20/2017, 9:45 AM

## 2016-10-27 LAB — OB RESULTS CONSOLE RUBELLA ANTIBODY, IGM: RUBELLA: IMMUNE

## 2016-10-27 LAB — OB RESULTS CONSOLE VARICELLA ZOSTER ANTIBODY, IGG: Varicella: NON-IMMUNE/NOT IMMUNE

## 2016-10-27 LAB — OB RESULTS CONSOLE GC/CHLAMYDIA
CHLAMYDIA, DNA PROBE: NEGATIVE
Gonorrhea: NEGATIVE

## 2016-10-27 LAB — OB RESULTS CONSOLE HEPATITIS B SURFACE ANTIGEN: Hepatitis B Surface Ag: NEGATIVE

## 2016-10-27 LAB — OB RESULTS CONSOLE HIV ANTIBODY (ROUTINE TESTING): HIV: NONREACTIVE

## 2017-02-26 LAB — OB RESULTS CONSOLE GBS: GBS: NEGATIVE

## 2017-03-19 ENCOUNTER — Encounter (HOSPITAL_COMMUNITY): Payer: Self-pay

## 2017-03-19 ENCOUNTER — Inpatient Hospital Stay (HOSPITAL_COMMUNITY)
Admission: AD | Admit: 2017-03-19 | Discharge: 2017-03-22 | DRG: 775 | Disposition: A | Discharge status: Home / Self Care | Payer: Medicaid Other | Source: Ambulatory Visit | Attending: Family Medicine | Admitting: Family Medicine

## 2017-03-19 DIAGNOSIS — Z3483 Encounter for supervision of other normal pregnancy, third trimester: Secondary | ICD-10-CM

## 2017-03-19 DIAGNOSIS — Z283 Underimmunization status: Secondary | ICD-10-CM

## 2017-03-19 DIAGNOSIS — Z3A40 40 weeks gestation of pregnancy: Secondary | ICD-10-CM

## 2017-03-19 DIAGNOSIS — O09899 Supervision of other high risk pregnancies, unspecified trimester: Secondary | ICD-10-CM

## 2017-03-20 ENCOUNTER — Encounter (HOSPITAL_COMMUNITY): Payer: Self-pay

## 2017-03-20 DIAGNOSIS — Z3A40 40 weeks gestation of pregnancy: Secondary | ICD-10-CM | POA: Diagnosis not present

## 2017-03-20 DIAGNOSIS — O09899 Supervision of other high risk pregnancies, unspecified trimester: Secondary | ICD-10-CM

## 2017-03-20 DIAGNOSIS — Z283 Underimmunization status: Secondary | ICD-10-CM

## 2017-03-20 LAB — CBC
HCT: 41.4 % (ref 36.0–46.0)
Hemoglobin: 14.4 g/dL (ref 12.0–15.0)
MCH: 30.9 pg (ref 26.0–34.0)
MCHC: 34.8 g/dL (ref 30.0–36.0)
MCV: 88.8 fL (ref 78.0–100.0)
Platelets: 165 K/uL (ref 150–400)
RBC: 4.66 MIL/uL (ref 3.87–5.11)
RDW: 13.6 % (ref 11.5–15.5)
WBC: 15 K/uL — ABNORMAL HIGH (ref 4.0–10.5)

## 2017-03-20 LAB — TYPE AND SCREEN
ABO/RH(D): B POS
ANTIBODY SCREEN: NEGATIVE

## 2017-03-20 LAB — RPR: RPR Ser Ql: NONREACTIVE

## 2017-03-20 MED ORDER — OXYCODONE-ACETAMINOPHEN 5-325 MG PO TABS: 2.0000 | ORAL_TABLET | ORAL | Status: DC | PRN

## 2017-03-20 MED ORDER — METHYLERGONOVINE MALEATE 0.2 MG PO TABS: 0.2000 mg | ORAL_TABLET | ORAL | Status: DC | PRN

## 2017-03-20 MED ORDER — BISACODYL 10 MG RE SUPP: 10.0000 mg | Freq: Every day | RECTAL | Status: DC | PRN

## 2017-03-20 MED ORDER — TETANUS-DIPHTH-ACELL PERTUSSIS 5-2.5-18.5 LF-MCG/0.5 IM SUSP: 0.5000 mL | Freq: Once | INTRAMUSCULAR | Status: DC

## 2017-03-20 MED ORDER — LACTATED RINGERS IV SOLN
INTRAVENOUS | Status: DC
  Administered 2017-03-20: via INTRAVENOUS

## 2017-03-20 MED ORDER — ONDANSETRON HCL 4 MG/2ML IJ SOLN: 4.0000 mg | Freq: Four times a day (QID) | INTRAMUSCULAR | Status: DC | PRN

## 2017-03-20 MED ORDER — OXYCODONE-ACETAMINOPHEN 5-325 MG PO TABS
1.0000 | ORAL_TABLET | ORAL | Status: DC | PRN
  Administered 2017-03-20: 1 via ORAL
  Filled 2017-03-20: qty 1

## 2017-03-20 MED ORDER — ZOLPIDEM TARTRATE 5 MG PO TABS: 5.0000 mg | ORAL_TABLET | Freq: Every evening | ORAL | Status: DC | PRN

## 2017-03-20 MED ORDER — IBUPROFEN 600 MG PO TABS
600.0000 mg | ORAL_TABLET | Freq: Four times a day (QID) | ORAL | Status: DC
  Administered 2017-03-20 – 2017-03-22 (×8): 600 mg via ORAL
  Filled 2017-03-20 (×9): qty 1

## 2017-03-20 MED ORDER — LIDOCAINE HCL (PF) 1 % IJ SOLN
30.0000 mL | INTRAMUSCULAR | Status: DC | PRN
  Administered 2017-03-20: 30 mL via SUBCUTANEOUS
  Filled 2017-03-20: qty 30

## 2017-03-20 MED ORDER — ONDANSETRON HCL 4 MG/2ML IJ SOLN: 4.0000 mg | INTRAMUSCULAR | Status: DC | PRN

## 2017-03-20 MED ORDER — OXYTOCIN 40 UNITS IN LACTATED RINGERS INFUSION - SIMPLE MED
2.5000 [IU]/h | INTRAVENOUS | Status: DC
Start: 2017-03-20 — End: 2017-03-20
  Filled 2017-03-20: qty 1000

## 2017-03-20 MED ORDER — COCONUT OIL OIL: 1.0000 "application " | TOPICAL_OIL | Status: DC | PRN

## 2017-03-20 MED ORDER — DIPHENHYDRAMINE HCL 25 MG PO CAPS: 25.0000 mg | ORAL_CAPSULE | Freq: Four times a day (QID) | ORAL | Status: DC | PRN

## 2017-03-20 MED ORDER — ACETAMINOPHEN 325 MG PO TABS: 650.0000 mg | ORAL_TABLET | ORAL | Status: DC | PRN

## 2017-03-20 MED ORDER — OXYCODONE HCL 5 MG PO TABS
10.0000 mg | ORAL_TABLET | ORAL | Status: DC | PRN
Start: 2017-03-20 — End: 2017-03-22

## 2017-03-20 MED ORDER — FENTANYL CITRATE (PF) 100 MCG/2ML IJ SOLN
50.0000 ug | INTRAMUSCULAR | Status: DC | PRN
  Administered 2017-03-20 (×2): 50 ug via INTRAVENOUS
  Filled 2017-03-20: qty 2

## 2017-03-20 MED ORDER — DOCUSATE SODIUM 100 MG PO CAPS
100.0000 mg | ORAL_CAPSULE | Freq: Two times a day (BID) | ORAL | Status: DC
  Administered 2017-03-20 – 2017-03-22 (×4): 100 mg via ORAL
  Filled 2017-03-20 (×4): qty 1

## 2017-03-20 MED ORDER — SIMETHICONE 80 MG PO CHEW: 80.0000 mg | CHEWABLE_TABLET | ORAL | Status: DC | PRN

## 2017-03-20 MED ORDER — FENTANYL CITRATE (PF) 100 MCG/2ML IJ SOLN
INTRAMUSCULAR | Status: AC
  Administered 2017-03-20: 50 ug via INTRAVENOUS
  Filled 2017-03-20: qty 2

## 2017-03-20 MED ORDER — MEASLES, MUMPS & RUBELLA VAC ~~LOC~~ INJ
0.5000 mL | INJECTION | Freq: Once | SUBCUTANEOUS | Status: DC
  Filled 2017-03-20: qty 0.5

## 2017-03-20 MED ORDER — FLEET ENEMA 7-19 GM/118ML RE ENEM: 1.0000 | ENEMA | RECTAL | Status: DC | PRN

## 2017-03-20 MED ORDER — FERROUS SULFATE 325 (65 FE) MG PO TABS
325.0000 mg | ORAL_TABLET | Freq: Two times a day (BID) | ORAL | Status: DC
  Administered 2017-03-20 – 2017-03-22 (×4): 325 mg via ORAL
  Filled 2017-03-20 (×5): qty 1

## 2017-03-20 MED ORDER — METHYLERGONOVINE MALEATE 0.2 MG/ML IJ SOLN: 0.2000 mg | INTRAMUSCULAR | Status: DC | PRN

## 2017-03-20 MED ORDER — LACTATED RINGERS IV SOLN: 500.0000 mL | INTRAVENOUS | Status: DC | PRN

## 2017-03-20 MED ORDER — DIBUCAINE 1 % RE OINT: 1.0000 "application " | TOPICAL_OINTMENT | RECTAL | Status: DC | PRN

## 2017-03-20 MED ORDER — PRENATAL MULTIVITAMIN CH
1.0000 | ORAL_TABLET | Freq: Every day | ORAL | Status: DC
  Administered 2017-03-21 – 2017-03-22 (×2): 1 via ORAL
  Filled 2017-03-20 (×2): qty 1

## 2017-03-20 MED ORDER — SOD CITRATE-CITRIC ACID 500-334 MG/5ML PO SOLN: 30.0000 mL | ORAL | Status: DC | PRN

## 2017-03-20 MED ORDER — BENZOCAINE-MENTHOL 20-0.5 % EX AERO
1.0000 "application " | INHALATION_SPRAY | CUTANEOUS | Status: DC | PRN
  Administered 2017-03-21: 1 via TOPICAL
  Filled 2017-03-20: qty 56

## 2017-03-20 MED ORDER — OXYTOCIN BOLUS FROM INFUSION
500.0000 mL | Freq: Once | INTRAVENOUS | Status: AC
  Administered 2017-03-20: 500 mL via INTRAVENOUS

## 2017-03-20 MED ORDER — ONDANSETRON HCL 4 MG PO TABS: 4.0000 mg | ORAL_TABLET | ORAL | Status: DC | PRN

## 2017-03-20 MED ORDER — FLEET ENEMA 7-19 GM/118ML RE ENEM: 1.0000 | ENEMA | Freq: Every day | RECTAL | Status: DC | PRN

## 2017-03-20 MED ORDER — WITCH HAZEL-GLYCERIN EX PADS: 1.0000 "application " | MEDICATED_PAD | CUTANEOUS | Status: DC | PRN

## 2017-03-20 MED ORDER — OXYCODONE HCL 5 MG PO TABS: 5.0000 mg | ORAL_TABLET | ORAL | Status: DC | PRN

## 2017-03-20 NOTE — Anesthesia Pain Management Evaluation Note (Addendum)
  [facility] Pain Management Visit Note  Patient: Emily Finley, 21 y.o., female  "Hello I am a [facility] of the anesthesia [facility]. We have an anesthesia team available at all times to provide care throughout the hospital, including epidural management and anesthesia for C-section. I don't know your plan for the delivery whether it a natural birth, water birth, IV sedation, nitrous supplementation, doula or epidural, but we want to meet your pain goals."   1.Was your pain managed to your expectations on prior hospitalizations?   Yes   2.What is your expectation for pain management during this hospitalization?     Labor support without medications from anesthesia;receiving IV meds on L and D but doesn't want epidural  3.How can we help you reach that goal? Be available;interpreter used  Record the patient's initial score and the patient's pain goal.   Pain: 10  Pain Goal: 10 [facility] wants you to be able to say your pain was always managed very well.  Edison PaceWILKERSON,Yatziri Wainwright 03/20/2017

## 2017-03-20 NOTE — Progress Notes (Signed)
Labor Progress Note Emily Finley is a 21 y.o. G2P0 at 2548w0d presented for SOL at term  S: Painfully contracting but breathing through and coping well. Does not desire pain medications.   O:  BP 119/78   Pulse 78   Temp 98 F (36.7 C) (Oral)   Resp 18  EFM: 130/mod/acc  CVE: Dilation: 4.5 Effacement (%): 90 Cervical Position: Middle Station: -2, -1 Presentation: Vertex Exam by:: Melburn PopperMichelle Williams, RN   A&P: 21 y.o. G2P0 2748w0d admitted for term early labor  #Labor: Progressing well. Latent phase.  #Pain: Non-pharmacological  #FWB: Cat I  #GBS negative   Al CorpusMatthew R Wilmina Maxham, MD 3:32 AM

## 2017-03-20 NOTE — H&P (Signed)
OBSTETRIC ADMISSION HISTORY AND PHYSICAL  [place] Emily Finley is a 21 y.o. female G2P0 with IUP at [redacted]w[redacted]d by LMP presenting for full term latent labor. She reports +FMs, No LOF, no VB, no blurry vision, headaches or peripheral edema, and RUQ pain.  She plans on breast feeding. She requests nexplanon for birth control.  Dating: By LMP--->  Estimated Date of Delivery: 03/20/17  Sono:    @22w  CWD, normal anatomy   Prenatal History/Complications:  Past Medical History: Past Medical History:  Diagnosis Date  . Medical history non-contributory     Past Surgical History: Past Surgical History:  Procedure Laterality Date  . NO PAST SURGERIES      Obstetrical History: OB History    Gravida Para Term Preterm AB Living   2             SAB TAB Ectopic Multiple Live Births                  Social History: Social History   Social History  . Marital status: Single    Spouse name: N/A  . Number of children: N/A  . Years of education: N/A   Social History Main Topics  . Smoking status: Never Smoker  . Smokeless tobacco: Never Used  . Alcohol use No  . Drug use: No  . Sexual activity: Yes   Other Topics Concern  . None   Social History Narrative  . None    Family History: History reviewed. No pertinent family history.  Allergies: No Known Allergies  Prescriptions Prior to Admission  Medication Sig Dispense Refill Last Dose  . HYDROcodone-acetaminophen (NORCO/VICODIN) 5-325 MG tablet Take 1-2 tablets by mouth every 4 (four) hours as needed. (Patient not taking: Reported on 02/07/2016) 10 tablet 0 Not Taking  . ibuprofen (ADVIL,MOTRIN) 600 MG tablet Take 1 tablet (600 mg total) by mouth every 6 (six) hours as needed. 30 tablet 0 Taking  . metroNIDAZOLE (FLAGYL) 500 MG tablet Take 1 tablet (500 mg total) by mouth 2 (two) times daily. (Patient not taking: Reported on 02/07/2016) 14 tablet 0 Not Taking  . prenatal vitamin w/FE, FA (PRENATAL 1 + 1) 27-1 MG TABS tablet  Take 1 tablet by mouth daily at 12 noon. Reported on 02/07/2016   Not Taking     Review of Systems   All systems reviewed and negative except as stated in HPI  Blood pressure 119/78, pulse 78, temperature 98 F (36.7 C), temperature source Oral, resp. rate 18, unknown if currently breastfeeding. General appearance: alert, cooperative and no distress Lungs: clear to auscultation bilaterally Heart: regular rate and rhythm Abdomen: soft, non-tender; bowel sounds normal Pelvic: Normal external genitalia  Extremities: Homans sign is negative, no sign of DVT Presentation: cephalic Fetal monitoringBaseline: 130 bpm, Variability: Good {> 6 bpm) and Accelerations: Reactive Uterine activityFrequency: Every 1-5 minutes Dilation: 3 Effacement (%): 90 Station: -2 Exam by:: Latricia Heft, RN   Prenatal labs: ABO, Rh: --/--/B POS (06/22 0010)B POS  Antibody: NEG (06/22 0010)Neg Rubella: Immune (01/29 0000)Immune RPR:   Neg HBsAg:   Neg HIV: Non-reactive (01/29 0000) Neg GBS: Negative (05/31 0000) Neg 1 hr Glucola Normal Genetic screening  Normal QUAD Anatomy US @22w  Normal   Prenatal Transfer Tool  Maternal Diabetes: No Genetic Screening: Normal Maternal Ultrasounds/Referrals: Normal Fetal Ultrasounds or other Referrals:  None Maternal Substance Abuse:  No Significant Maternal Medications:  None Significant Maternal Lab Results: None  Results for orders placed or performed during the hospital encounter of  03/19/17 (from the past 24 hour(s))  CBC   Collection Time: 03/20/17 12:10 AM  Result Value Ref Range   WBC 15.0 (H) 4.0 - 10.5 K/uL   RBC 4.66 3.87 - 5.11 MIL/uL   Hemoglobin 14.4 12.0 - 15.0 g/dL   HCT 16.141.4 09.636.0 - 04.546.0 %   MCV 88.8 78.0 - 100.0 fL   MCH 30.9 26.0 - 34.0 pg   MCHC 34.8 30.0 - 36.0 g/dL   RDW 40.913.6 81.111.5 - 91.415.5 %   Platelets 165 150 - 400 K/uL  Type and screen [facility] [place]   Collection Time: 03/20/17 12:10 AM  Result Value Ref Range    ABO/RH(D) B POS    Antibody Screen NEG    Sample Expiration 03/23/2017     Patient Active Problem List   Diagnosis Date Noted  . Maternal varicella, non-immune 03/20/2017  . Normal labor 03/20/2017  . SAB (spontaneous abortion) 02/07/2016    Assessment/Plan:  Emily Finley is a 21 y.o. G2P0 at 5861w0d here for early labor   #Labor: Expectant management #Pain: Nonpharmacological  #FWB: Cat I  #ID: GBS neg #MOF: Breast  #MOC: Undecided, considering nexplanon #Circ: Not desired   Al CorpusMatthew R Zeitler, MD  03/20/2017, 2:00 AM  I have seen and examined this patient and agree with the management plan.

## 2017-03-20 NOTE — Lactation Note (Signed)
This note was copied from a baby's chart. Lactation Consultation Note  Patient Name: Emily Finley Today's Date: 03/20/2017 Reason for consult: Initial assessment;NICU baby  NICU baby 39 hours old. Assisted by in-house Spanish interpreter [place]. Assisted mom to use DEBP. Reviewed assembly, disassembly and cleaning of pump parts. Mom able to return demonstrate hand expression with colostrum flowing bilaterally. Reviewed EBM storage guidelines and enc parents to take EBM to NICU. Mom reports that she is active with [facility] and gave permission for BF referral to be sent--and it was faxed to [facility]. Mom aware of [facility] loaner program and pumping rooms in NICU. Mom knows to take pumping kit with her at D/C. Enc mom to offer STS and nuzzling at breast as she and baby able.   Maternal Data Has patient been taught Hand Expression?: Yes Does the patient have breastfeeding experience prior to this delivery?: No  Feeding Feeding Type: Formula Length of feed: 30 min  LATCH Score/Interventions                      Lactation Tools Discussed/Used WIC Program: Yes Pump Review: Setup, frequency, and cleaning;Milk Storage Initiated by:: JW Date initiated:: 03/20/17   Consult Status Consult Status: Follow-up Date: 03/21/17 Follow-up type: In-patient    Andres Labrum 03/20/2017, 7:23 PM

## 2017-03-21 DIAGNOSIS — Z3A40 40 weeks gestation of pregnancy: Secondary | ICD-10-CM | POA: Diagnosis not present

## 2017-03-21 DIAGNOSIS — Z3493 Encounter for supervision of normal pregnancy, unspecified, third trimester: Secondary | ICD-10-CM | POA: Diagnosis present

## 2017-03-21 NOTE — Progress Notes (Signed)
POSTPARTUM PROGRESS NOTE  Post Partum Day #1 SVD  Subjective:  Emily Finley is a 21 y.o. G2P1001 3270w0d s/p SVD.  No acute events overnight.  Pt denies problems with ambulating, voiding or po intake.  She denies nausea or vomiting.  Pain is well controlled.  She has had flatus. She has not had bowel movement.  Lochia Minimal.   Objective: Blood pressure 110/61, pulse 75, temperature 98.1 F (36.7 C), temperature source Oral, resp. rate 16, height 5\' 5"  (1.651 m), weight 56.7 kg (125 lb), SpO2 100 %, unknown if currently breastfeeding.  Physical Exam:  General: alert, cooperative and no distress Lochia:normal flow Uterine Fundus: firm, below umbilicus DVT Evaluation: No calf swelling or tenderness Extremities: no LE edema   Recent Labs  03/20/17 0010  HGB 14.4  HCT 41.4    Assessment/Plan:  ASSESSMENT: Emily Finley is a 21 y.o. G2P1001 470w0d s/p SVD  Plan for discharge tomorrow   LOS: 0 days   Howard PouchLauren Feng, MD PGY-1 [facility]  03/21/2017, 7:46 AM    OB FELLOW POSTPARTUM PROGRESS NOTE ATTESTATION  I have seen and examined this patient and agree with above documentation in the resident's note.   Jen MowElizabeth Mumaw, DO OB Fellow

## 2017-03-21 NOTE — Lactation Note (Signed)
This note was copied from a baby's chart. Lactation Consultation Note: Mother has family member at bedside to interpret for her.  Mother reports that she is pumping every 2-3 hours. Mother reports that she pumped approx 20 ml the last pumping. Encouraged mother to continue to pump every 2-3 hours for 15-20 mins. Mother advised to continue to hand express before and after pumping.  Mother denies having any concerns or questions.   Patient Name: Emily Finley [id number]'s Date: 03/21/2017 Reason for consult: Follow-up assessment   Maternal Data    Feeding Feeding Type: Breast Milk Length of feed: 30 min  LATCH Score/Interventions                      Lactation Tools Discussed/Used     Consult Status Consult Status: Follow-up Date: 03/21/17 Follow-up type: In-patient    Stevan BornKendrick, Shuntel Fishburn [facility] 03/21/2017, 3:02 PM

## 2017-03-21 NOTE — Plan of Care (Signed)
Problem: Education: Goal: Knowledge of condition will improve Outcome: Completed/Met Date Met: 03/21/17 Using the in house interpreter, peri care and use of dermoplast discussed.   Pt verbalized understanding.   Problem: Nutritional: Goal: Mothers verbalization of comfort with breastfeeding process will improve Outcome: Progressing Using the in house interpreter, discussed frequency of pumping and hand expression.  Discussed supply and demand.  MOB verbalized understanding.

## 2017-03-22 MED ORDER — IBUPROFEN 600 MG PO TABS: 600.0000 mg | ORAL_TABLET | Freq: Four times a day (QID) | ORAL | 0 refills | Status: DC

## 2017-03-22 MED ORDER — FERROUS SULFATE 325 (65 FE) MG PO TABS: 325.0000 mg | ORAL_TABLET | Freq: Two times a day (BID) | ORAL | 3 refills | Status: DC

## 2017-03-22 MED ORDER — DOCUSATE SODIUM 100 MG PO CAPS: 100.0000 mg | ORAL_CAPSULE | Freq: Two times a day (BID) | ORAL | 0 refills | Status: DC

## 2017-03-22 NOTE — Discharge Summary (Signed)
OB Discharge Summary     Patient Name: Emily Finley DOB: September 26, 1996 MRN: [id number]  Date of admission: 03/19/2017 Delivering MD: Jacklyn ShellRESENZO-DISHMON, FRANCES   Date of discharge: 03/22/2017  Admitting diagnosis: 39.6 WKS, CTXS Intrauterine pregnancy: 5071w0d     Secondary diagnosis:  Principal Problem:   Normal labor Active Problems:   Maternal varicella, non-immune   NSVD (normal spontaneous vaginal delivery)  Additional problems: None     Discharge diagnosis: Term Pregnancy Delivered                                                                                                Post partum procedures:None  Augmentation: AROM  Complications: None  Hospital course:  Onset of Labor With Vaginal Delivery     21 y.o. yo G2P1001 at 6071w0d was admitted in Active Labor on 03/19/2017. Patient had an uncomplicated labor course as follows:  Membrane Rupture Time/Date: 8:35 AM ,03/20/2017   Intrapartum Procedures: Episiotomy: None [1]                                         Lacerations:  1st degree [2];Perineal [11]  Patient had a delivery of a Viable infant. 03/20/2017  Information for the patient's newborn:  Emily Finley Finley, Emily Tadeo [[id number]][[id number]]  Delivery Method: Vaginal, Spontaneous Delivery (Filed from Delivery Summary)    [name] had an uncomplicated postpartum course.  She is ambulating, tolerating a regular diet, passing flatus, and urinating well. Patient is discharged home in stable condition on 03/30/17.   Physical exam  Vitals:   03/21/17 0006 03/21/17 0500 03/21/17 1727 03/22/17 0632  BP: 109/63 110/61 108/64 (!) 107/59  Pulse: 93 75 87 68  Resp: 20 16 16 16   Temp: 98.8 F (37.1 C) 98.1 F (36.7 C) 98 F (36.7 C) 97.9 F (36.6 C)  TempSrc: Oral Oral Oral Oral  SpO2: 99% 100% 99%   Weight:      Height:       General: alert, cooperative and no distress Lochia: appropriate Uterine Fundus: firm Incision: N/A DVT Evaluation: No evidence of DVT seen on  physical exam. Negative Homan's sign. No cords or calf tenderness. No significant calf/ankle edema. Labs: Lab Results  Component Value Date   WBC 15.0 (H) 03/20/2017   HGB 14.4 03/20/2017   HCT 41.4 03/20/2017   MCV 88.8 03/20/2017   PLT 165 03/20/2017   No flowsheet data found.  Discharge instruction: per After Visit Summary and "Baby and Me Booklet".  After visit meds:  Allergies as of 03/22/2017   No Known Allergies     Medication List    TAKE these medications   docusate sodium 100 MG capsule Commonly known as:  COLACE Take 1 capsule (100 mg total) by mouth 2 (two) times daily.   ferrous sulfate 325 (65 FE) MG tablet Take 1 tablet (325 mg total) by mouth 2 (two) times daily with a meal.   ibuprofen 600 MG tablet Commonly known as:  ADVIL,MOTRIN Take 1 tablet (600 mg  total) by mouth every 6 (six) hours.   prenatal vitamin w/FE, FA 27-1 MG Tabs tablet Take 1 tablet by mouth daily at 12 noon. Reported on 02/07/2016       Diet: routine diet  Activity: Advance as tolerated. Pelvic rest for 6 weeks.   Outpatient follow up:6 weeks Follow up Appt:No future appointments. Follow up Visit:No Follow-up on file.  Postpartum contraception: Nexplanon  Newborn Data: Live born female  Birth Weight: 6 lb 9.1 oz (2980 g) APGAR: 9, 9  Baby Feeding: Breast Disposition:NICU   03/22/2017 Jen Mow, DO OB Fellow

## 2017-03-22 NOTE — Progress Notes (Signed)
Discharge instructions reviewed with spanish interpreter at the bedside. Questions answered, prescriptions reviewed.

## 2017-03-22 NOTE — Lactation Note (Signed)
This note was copied from a baby's chart. Lactation Consultation Note: [facility] She was advised to continue to pump every 2-3 hours for 15-20 mins. Reviewed collection , storage and transportation of breastmilk. Father of infant at bedside for all translating. Mother receptive to all teaching. Mother pumping now and is getting approx 30-45 ml. Mother advised to do frequent skin to skin. Father reports that they live 15 mins away and plan to come every 3 hours to feed infant. Mother receptive to all teaching.   Patient Name: Emily Finley ZOXWR'UToday's Date: 03/22/2017 Reason for consult: Follow-up assessment   Maternal Data    Feeding Feeding Type: Formula Nipple Type: Slow - flow Length of feed: 45 min (30 min to try and nipple, gavage the rest)  LATCH Score/Interventions                      Lactation Tools Discussed/Used     Consult Status Consult Status: Complete    Michel BickersKendrick, [place] [facility] 03/22/2017, 11:57 AM

## 2017-03-22 NOTE — Discharge Instructions (Signed)
Home Care Instructions for Mom °ACTIVITY °· Gradually return to your regular activities. °· Let yourself rest. Nap while your baby sleeps. °· Avoid lifting anything that is heavier than 10 lb (4.5 kg) until your health care provider says it is okay. °· Avoid activities that take a lot of effort and energy (are strenuous) until approved by your health care provider. Walking at a slow-to-moderate pace is usually safe. °· If you had a cesarean delivery: °? Do not vacuum, climb stairs, or drive a car for 4-6 weeks. °? Have someone help you at home until you feel like you can do your usual activities yourself. °? Do exercises as told by your health care provider, if this applies. ° °VAGINAL BLEEDING °You may continue to bleed for 4-6 weeks after delivery. Over time, the amount of blood usually decreases and the color of the blood usually gets lighter. However, the flow of bright red blood may increase if you have been too active. If you need to use more than one pad in an hour because your pad gets soaked, or if you pass a large clot: °· Lie down. °· Raise your feet. °· Place a cold compress on your lower abdomen. °· Rest. °· Call your health care provider. ° °If you are breastfeeding, your period should return anytime between 8 weeks after delivery and the time that you stop breastfeeding. If you are not breastfeeding, your period should return 6-8 weeks after delivery. °PERINEAL CARE °The perineal area, or perineum, is the part of your body between your thighs. After delivery, this area needs special care. Follow these instructions as told by your health care provider. °· Take warm tub baths for 15-20 minutes. °· Use medicated pads and pain-relieving sprays and creams as told. °· Do not use tampons or douches until vaginal bleeding has stopped. °· Each time you go to the bathroom: °? Use a peri bottle. °? Change your pad. °? Use towelettes in place of toilet paper until your stitches have healed. °· Do Kegel exercises  every day. Kegel exercises help to maintain the muscles that support the vagina, bladder, and bowels. You can do these exercises while you are standing, sitting, or lying down. To do Kegel exercises: °? Tighten the muscles of your abdomen and the muscles that surround your birth canal. °? Hold for a few seconds. °? Relax. °? Repeat until you have done this 5 times in a row. °· To prevent hemorrhoids from developing or getting worse: °? Drink enough fluid to keep your urine clear or pale yellow. °? Avoid straining when having a bowel movement. °? Take over-the-counter medicines and stool softeners as told by your health care provider. ° °BREAST CARE °· Wear a tight-fitting bra. °· Avoid taking over-the-counter pain medicine for breast discomfort. °· Apply ice to the breasts to help with discomfort as needed: °? Put ice in a plastic bag. °? Place a towel between your skin and the bag. °? Leave the ice on for 20 minutes or as told by your health care provider. ° °NUTRITION °· Eat a well-balanced diet. °· Do not try to lose weight quickly by cutting back on calories. °· Take your prenatal vitamins until your postpartum checkup or until your health care provider tells you to stop. ° °POSTPARTUM DEPRESSION °You may find yourself crying for no apparent reason and unable to cope with all of the changes that come with having a newborn. This mood is called postpartum depression. Postpartum depression happens because your hormone   levels change after delivery. If you have postpartum depression, get support from your partner, friends, and family. If the depression does not go away on its own after several weeks, contact your health care provider. °BREAST SELF-EXAM °Do a breast self-exam each month, at the same time of the month. If you are breastfeeding, check your breasts just after a feeding, when your breasts are less full. If you are breastfeeding and your period has started, check your breasts on day 5, 6, or 7 of your  period. °Report any lumps, bumps, or discharge to your health care provider. Know that breasts are normally lumpy if you are breastfeeding. This is temporary, and it is not a health risk. °INTIMACY AND SEXUALITY °Avoid sexual activity for at least 3-4 weeks after delivery or until the brownish-red vaginal flow is completely gone. If you want to avoid pregnancy, use some form of birth control. You can get pregnant after delivery, even if you have not had your period. °SEEK MEDICAL CARE IF: °· You feel unable to cope with the changes that a child brings to your life, and these feelings do not go away after several weeks. °· You notice a lump, a bump, or discharge on your breast. ° °SEEK IMMEDIATE MEDICAL CARE IF: °· Blood soaks your pad in 1 hour or less. °· You have: °? Severe pain or cramping in your lower abdomen. °? A bad-smelling vaginal discharge. °? A fever that is not controlled by medicine. °? A fever, and an area of your breast is red and sore. °? Pain or redness in your calf. °? Sudden, severe chest pain. °? Shortness of breath. °? Painful or bloody urination. °? Problems with your vision. °· You vomit for 12 hours or longer. °· You develop a severe headache. °· You have serious thoughts about hurting yourself, your child, or anyone else. ° °This information is not intended to replace advice given to you by your health care provider. Make sure you discuss any questions you have with your health care provider. °Document Released: 09/12/2000 Document Revised: 02/21/2016 Document Reviewed: 03/19/2015 °Elsevier Interactive Patient Education © 2017 [employer] ° °Vaginal Delivery, Care After °Refer to this sheet in the next few weeks. These instructions provide you with information about caring for yourself after vaginal delivery. Your health care provider may also give you more specific instructions. Your treatment has been planned according to current medical practices, but problems sometimes occur. Call  your health care provider if you have any problems or questions. °What can I expect after the procedure? °After vaginal delivery, it is common to have: °· Some bleeding from your vagina. °· Soreness in your abdomen, your vagina, and the area of skin between your vaginal opening and your anus (perineum). °· Pelvic cramps. °· Fatigue. ° °Follow these instructions at home: °Medicines °· Take over-the-counter and prescription medicines only as told by your health care provider. °· If you were prescribed an antibiotic medicine, take it as told by your health care provider. Do not stop taking the antibiotic until it is finished. °Driving ° °· Do not drive or operate heavy machinery while taking prescription pain medicine. °· Do not drive for 24 hours if you received a sedative. °Lifestyle °· Do not drink alcohol. This is especially important if you are breastfeeding or taking medicine to relieve pain. °· Do not use tobacco products, including cigarettes, chewing tobacco, or e-cigarettes. If you need help quitting, ask your health care provider. °Eating and drinking °· Drink at least   8 eight-ounce glasses of water every day unless you are told not to by your health care provider. If you choose to breastfeed your baby, you may need to drink more water than this. °· Eat high-fiber foods every day. These foods may help prevent or relieve constipation. High-fiber foods include: °? Whole grain cereals and breads. °? Brown rice. °? Beans. °? Fresh fruits and vegetables. °Activity °· Return to your normal activities as told by your health care provider. Ask your health care provider what activities are safe for you. °· Rest as much as possible. Try to rest or take a nap when your baby is sleeping. °· Do not lift anything that is heavier than your baby or 10 lb (4.5 kg) until your health care provider says that it is safe. °· Talk with your health care provider about when you can engage in sexual activity. This may depend on  your: °? Risk of infection. °? Rate of healing. °? Comfort and desire to engage in sexual activity. °Vaginal Care °· If you have an episiotomy or a vaginal tear, check the area every day for signs of infection. Check for: °? More redness, swelling, or pain. °? More fluid or blood. °? Warmth. °? Pus or a bad smell. °· Do not use tampons or douches until your health care provider says this is safe. °· Watch for any blood clots that may pass from your vagina. These may look like clumps of dark red, brown, or black discharge. °General instructions °· Keep your perineum clean and dry as told by your health care provider. °· Wear loose, comfortable clothing. °· Wipe from front to back when you use the toilet. °· Ask your health care provider if you can shower or take a bath. If you had an episiotomy or a perineal tear during labor and delivery, your health care provider may tell you not to take baths for a certain length of time. °· Wear a bra that supports your breasts and fits you well. °· If possible, have someone help you with household activities and help care for your baby for at least a few days after you leave the hospital. °· Keep all follow-up visits for you and your baby as told by your health care provider. This is important. °Contact a health care provider if: °· You have: °? Vaginal discharge that has a bad smell. °? Difficulty urinating. °? Pain when urinating. °? A sudden increase or decrease in the frequency of your bowel movements. °? More redness, swelling, or pain around your episiotomy or vaginal tear. °? More fluid or blood coming from your episiotomy or vaginal tear. °? Pus or a bad smell coming from your episiotomy or vaginal tear. °? A fever. °? A rash. °? Little or no interest in activities you used to enjoy. °? Questions about caring for yourself or your baby. °· Your episiotomy or vaginal tear feels warm to the touch. °· Your episiotomy or vaginal tear is separating or does not appear to be  healing. °· Your breasts are painful, hard, or turn red. °· You feel unusually sad or worried. °· You feel nauseous or you vomit. °· You pass large blood clots from your vagina. If you pass a blood clot from your vagina, save it to show to your health care provider. Do not flush blood clots down the toilet without having your health care provider look at them. °· You urinate more than usual. °· You are dizzy or light-headed. °· You have   not breastfed at all and you have not had a menstrual period for 12 weeks after delivery. °· You have stopped breastfeeding and you have not had a menstrual period for 12 weeks after you stopped breastfeeding. °Get help right away if: °· You have: °? Pain that does not go away or does not get better with medicine. °? Chest pain. °? Difficulty breathing. °? Blurred vision or spots in your vision. °? Thoughts about hurting yourself or your baby. °· You develop pain in your abdomen or in one of your legs. °· You develop a severe headache. °· You faint. °· You bleed from your vagina so much that you fill two sanitary pads in one hour. °This information is not intended to replace advice given to you by your health care provider. Make sure you discuss any questions you have with your health care provider. °Document Released: 09/12/2000 Document Revised: 02/27/2016 Document Reviewed: 09/30/2015 °Elsevier Interactive Patient Education © 2017 Elsevier Inc. ° °

## 2017-03-23 NOTE — Progress Notes (Signed)
Patient screened out for psychosocial assessment since none of the following apply:  Psychosocial stressors documented in mother or baby's chart  Gestation less than 32 weeks  Code at delivery   Infant with anomalies Please contact the Clinical Social Worker if specific needs arise, or by MOB's request.   Laneah Luft Boyd-Gilyard, MSW, LCSW Clinical Social Work [phone]  

## 2018-09-29 NOTE — L&D Delivery Note (Addendum)
Delivery Note Catlin Doria Aretha Parrot is a 23 y.o. G3P1001 at [redacted]w[redacted]d admitted for spontaneous labor.  Labor course: Uncomplicated. Patient AROM'd and quickly progressed to C/C/+1 ROM: 0h 26m with clear fluid  At 1335 a viable boy was delivered via spontaneous vaginal delivery (Presentation: cephalic with compound hand; LOA). Nuchal x1. Baby was somersaulted and delivered in usual fashion. Infant placed directly on mom's abdomen for bonding/skin-to-skin. Delayed cord clamping x 48min, then cord clamped x 2, and cut by FOB.  APGAR: 9,9; weight: pending at time of note. 40 units of pitocin diluted in 1000cc LR was infused rapidly IV per protocol. The placenta separated spontaneously and delivered via CCT and maternal pushing effort.  It was inspected and appears to be intact with a 3 VC.  Placenta/Cord with the following complications: none. Cord pH: n/a  Intrapartum complications:  None Anesthesia:  none Episiotomy: none Lacerations:  none Suture Repair: n/a Est. Blood Loss (mL): 5 Sponge and instrument count were correct x2.  Mom to postpartum.  Baby to Couplet care / Skin to Skin. Placenta to L&D. Plans to breast and bottle feed Contraception: condoms Circ: no  Renee Harder, SNM 08/28/2019 2:07 PM   Patient is a 23 y.o. at [redacted]w[redacted]d who was admitted for spontaneous onset of labor, uncomplicated prenatal course.  She progressed with augmentation via AROM.  I was gloved and present for delivery in its entirety.  Second stage of labor progressed, baby delivered after 25 minutes.   Complications: compound hand, nuchal x1  Wende Mott, CNM 2:28 PM

## 2019-03-31 LAB — OB RESULTS CONSOLE HIV ANTIBODY (ROUTINE TESTING): HIV: NONREACTIVE

## 2019-03-31 LAB — OB RESULTS CONSOLE RUBELLA ANTIBODY, IGM: Rubella: NON-IMMUNE/NOT IMMUNE

## 2019-03-31 LAB — OB RESULTS CONSOLE HEPATITIS B SURFACE ANTIGEN: Hepatitis B Surface Ag: NEGATIVE

## 2019-03-31 LAB — OB RESULTS CONSOLE RPR: RPR: NONREACTIVE

## 2019-08-11 LAB — OB RESULTS CONSOLE GC/CHLAMYDIA
Chlamydia: NEGATIVE
Gonorrhea: NEGATIVE

## 2019-08-11 LAB — OB RESULTS CONSOLE GBS: GBS: NEGATIVE

## 2019-08-28 ENCOUNTER — Inpatient Hospital Stay (HOSPITAL_COMMUNITY)
Admission: AD | Admit: 2019-08-28 | Discharge: 2019-08-29 | DRG: 807 | Disposition: A | Discharge status: Home / Self Care | Payer: Medicaid Other | Attending: Obstetrics and Gynecology | Admitting: Obstetrics and Gynecology

## 2019-08-28 ENCOUNTER — Other Ambulatory Visit: Payer: Self-pay

## 2019-08-28 ENCOUNTER — Encounter (HOSPITAL_COMMUNITY): Payer: Self-pay | Admitting: *Deleted

## 2019-08-28 DIAGNOSIS — O322XX0 Maternal care for transverse and oblique lie, not applicable or unspecified: Secondary | ICD-10-CM | POA: Diagnosis present

## 2019-08-28 DIAGNOSIS — Z3A39 39 weeks gestation of pregnancy: Secondary | ICD-10-CM

## 2019-08-28 DIAGNOSIS — O039 Complete or unspecified spontaneous abortion without complication: Secondary | ICD-10-CM

## 2019-08-28 DIAGNOSIS — Z20828 Contact with and (suspected) exposure to other viral communicable diseases: Secondary | ICD-10-CM | POA: Diagnosis present

## 2019-08-28 DIAGNOSIS — O326XX0 Maternal care for compound presentation, not applicable or unspecified: Secondary | ICD-10-CM

## 2019-08-28 DIAGNOSIS — O26893 Other specified pregnancy related conditions, third trimester: Secondary | ICD-10-CM | POA: Diagnosis present

## 2019-08-28 LAB — SARS CORONAVIRUS 2 BY RT PCR (HOSPITAL ORDER, PERFORMED IN ~~LOC~~ HOSPITAL LAB): SARS Coronavirus 2: NEGATIVE

## 2019-08-28 LAB — CBC
HCT: 40.4 % (ref 36.0–46.0)
Hemoglobin: 13.7 g/dL (ref 12.0–15.0)
MCH: 30.5 pg (ref 26.0–34.0)
MCHC: 33.9 g/dL (ref 30.0–36.0)
MCV: 90 fL (ref 80.0–100.0)
Platelets: 156 10*3/uL (ref 150–400)
RBC: 4.49 MIL/uL (ref 3.87–5.11)
RDW: 12.5 % (ref 11.5–15.5)
WBC: 14.3 10*3/uL — ABNORMAL HIGH (ref 4.0–10.5)
nRBC: 0 % (ref 0.0–0.2)

## 2019-08-28 LAB — TYPE AND SCREEN
ABO/RH(D): B POS
Antibody Screen: NEGATIVE

## 2019-08-28 LAB — ABO/RH: ABO/RH(D): B POS

## 2019-08-28 LAB — RPR: RPR Ser Ql: NONREACTIVE

## 2019-08-28 MED ORDER — LIDOCAINE HCL (PF) 1 % IJ SOLN: 30.0000 mL | INTRAMUSCULAR | Status: DC | PRN

## 2019-08-28 MED ORDER — MEASLES, MUMPS & RUBELLA VAC IJ SOLR
0.5000 mL | Freq: Once | INTRAMUSCULAR | Status: AC
  Administered 2019-08-29: 0.5 mL via SUBCUTANEOUS
  Filled 2019-08-28: qty 0.5

## 2019-08-28 MED ORDER — COCONUT OIL OIL: 1.0000 "application " | TOPICAL_OIL | Status: DC | PRN

## 2019-08-28 MED ORDER — LACTATED RINGERS IV SOLN: 500.0000 mL | INTRAVENOUS | Status: DC | PRN

## 2019-08-28 MED ORDER — PRENATAL MULTIVITAMIN CH
1.0000 | ORAL_TABLET | Freq: Every day | ORAL | Status: DC
  Administered 2019-08-29: 1 via ORAL
  Filled 2019-08-28: qty 1

## 2019-08-28 MED ORDER — IBUPROFEN 600 MG PO TABS
600.0000 mg | ORAL_TABLET | Freq: Four times a day (QID) | ORAL | Status: DC
  Administered 2019-08-28 – 2019-08-29 (×4): 600 mg via ORAL
  Filled 2019-08-28 (×4): qty 1

## 2019-08-28 MED ORDER — DIPHENHYDRAMINE HCL 25 MG PO CAPS: 25.0000 mg | ORAL_CAPSULE | Freq: Four times a day (QID) | ORAL | Status: DC | PRN

## 2019-08-28 MED ORDER — DIBUCAINE (PERIANAL) 1 % EX OINT: 1.0000 "application " | TOPICAL_OINTMENT | CUTANEOUS | Status: DC | PRN

## 2019-08-28 MED ORDER — WITCH HAZEL-GLYCERIN EX PADS: 1.0000 "application " | MEDICATED_PAD | CUTANEOUS | Status: DC | PRN

## 2019-08-28 MED ORDER — ONDANSETRON HCL 4 MG/2ML IJ SOLN: 4.0000 mg | Freq: Four times a day (QID) | INTRAMUSCULAR | Status: DC | PRN

## 2019-08-28 MED ORDER — OXYCODONE-ACETAMINOPHEN 5-325 MG PO TABS: 2.0000 | ORAL_TABLET | ORAL | Status: DC | PRN

## 2019-08-28 MED ORDER — OXYCODONE-ACETAMINOPHEN 5-325 MG PO TABS: 1.0000 | ORAL_TABLET | ORAL | Status: DC | PRN

## 2019-08-28 MED ORDER — ONDANSETRON HCL 4 MG/2ML IJ SOLN: 4.0000 mg | INTRAMUSCULAR | Status: DC | PRN

## 2019-08-28 MED ORDER — BENZOCAINE-MENTHOL 20-0.5 % EX AERO: 1.0000 "application " | INHALATION_SPRAY | CUTANEOUS | Status: DC | PRN

## 2019-08-28 MED ORDER — ACETAMINOPHEN 325 MG PO TABS: 650.0000 mg | ORAL_TABLET | ORAL | Status: DC | PRN

## 2019-08-28 MED ORDER — ONDANSETRON HCL 4 MG PO TABS: 4.0000 mg | ORAL_TABLET | ORAL | Status: DC | PRN

## 2019-08-28 MED ORDER — ZOLPIDEM TARTRATE 5 MG PO TABS: 5.0000 mg | ORAL_TABLET | Freq: Every evening | ORAL | Status: DC | PRN

## 2019-08-28 MED ORDER — OXYCODONE HCL 5 MG PO TABS
5.0000 mg | ORAL_TABLET | ORAL | Status: DC | PRN
  Administered 2019-08-29: 5 mg via ORAL
  Filled 2019-08-28: qty 1

## 2019-08-28 MED ORDER — OXYTOCIN 40 UNITS IN NORMAL SALINE INFUSION - SIMPLE MED
2.5000 [IU]/h | INTRAVENOUS | Status: DC
  Filled 2019-08-28: qty 1000

## 2019-08-28 MED ORDER — SOD CITRATE-CITRIC ACID 500-334 MG/5ML PO SOLN: 30.0000 mL | ORAL | Status: DC | PRN

## 2019-08-28 MED ORDER — SIMETHICONE 80 MG PO CHEW: 80.0000 mg | CHEWABLE_TABLET | ORAL | Status: DC | PRN

## 2019-08-28 MED ORDER — FLEET ENEMA 7-19 GM/118ML RE ENEM: 1.0000 | ENEMA | RECTAL | Status: DC | PRN

## 2019-08-28 MED ORDER — LACTATED RINGERS IV SOLN
INTRAVENOUS | Status: DC
  Administered 2019-08-28: 12:00:00 via INTRAVENOUS

## 2019-08-28 MED ORDER — OXYCODONE HCL 5 MG PO TABS: 10.0000 mg | ORAL_TABLET | ORAL | Status: DC | PRN

## 2019-08-28 MED ORDER — OXYTOCIN BOLUS FROM INFUSION
500.0000 mL | Freq: Once | INTRAVENOUS | Status: AC
  Administered 2019-08-28: 500 mL via INTRAVENOUS

## 2019-08-28 MED ORDER — SENNOSIDES-DOCUSATE SODIUM 8.6-50 MG PO TABS
2.0000 | ORAL_TABLET | ORAL | Status: DC
  Administered 2019-08-29: 2 via ORAL
  Filled 2019-08-28: qty 2

## 2019-08-28 NOTE — Lactation Note (Signed)
This note was copied from a baby's chart. Lactation Consultation Note  Patient Name: June Park DGLOV'F Date: 08/28/2019 Reason for consult: Initial assessment;Term  4 hours FT female who is being exclusively BF by his mother, she's a P2 and experienced BF. She BF her first child for 6 months and experienced BF difficulties only in the beginning during the first month, and then it went smoothly after she got a DEBP from [facility] for her first child. She also participated in the program at the [facility] during this pregnancy and she's familiar with hand expression and able to get colostrum. She has a hand pump at home.  Offered assistance with latch but she politely declined, she told LC baby wasn't due for a feeding. Asked mom to call for assistance when needed. Reviewed normal newborn behavior, cluster feeding and feeding cues. Mom also declined assistance with hand expression, but told LC that she feels comfortable doing it, she reported that feedings at the breast were comfortable.  Feeding plan:  1. Encouraged mom to feed baby STS 8-12 times/24 hours or sooner if feeding cues are present 2. Hand expression and spoon feeding were also encouraged  BF brochure (SP), BF resources (SP) and feeding diary (SP) were reviewed. Mom reported all questions and concerns were answered, she's aware of [facility] OP services and will call PRN.    Maternal Data Formula Feeding for Exclusion: Yes Reason for exclusion: Mother's choice to formula and breast feed on admission Has patient been taught Hand Expression?: Yes Does the patient have breastfeeding experience prior to this delivery?: Yes  Feeding Feeding Type: Breast Fed  LATCH Score Latch: Repeated attempts needed to sustain latch, nipple held in mouth throughout feeding, stimulation needed to elicit sucking reflex.  Audible Swallowing: A few with stimulation  Type of Nipple: Everted at rest and after stimulation  Comfort (Breast/Nipple):  Soft / non-tender  Hold (Positioning): No assistance needed to correctly position infant at breast.  LATCH Score: 8  Interventions Interventions: Breast feeding basics reviewed  Lactation Tools Discussed/Used WIC Program: Yes   Consult Status Consult Status: Follow-up Date: 08/29/19 Follow-up type: In-patient    Waverley Krempasky Francene Boyers 08/28/2019, 6:03 PM

## 2019-08-28 NOTE — Progress Notes (Signed)
[place], present at [facility], for admission.

## 2019-08-28 NOTE — Discharge Summary (Addendum)
Postpartum Discharge Summary  Date of Service updated 08/29/2019     Patient Name: Emily Finley DOB: 1996-04-11 MRN: [id number]  Date of admission: 08/28/2019 Delivering Provider: Renee Harder   Date of discharge: 08/29/2019  Admitting diagnosis: CTX less than three Intrauterine pregnancy: [redacted]w[redacted]d    Secondary diagnosis:  Active Problems:   Normal labor  Additional problems: none     Discharge diagnosis: Term Pregnancy Delivered                                                                                                Post partum procedures:none  Augmentation: AROM  Complications: None  Hospital course:  Onset of Labor With Vaginal Delivery     23y.o. yo G3P1001 at 326w0das admitted in Active Labor on 08/28/2019. Patient had an uncomplicated labor course as follows: arrived at 9.5cm, AROM and rapid delivery Membrane Rupture Time/Date: 1:06 PM ,08/28/2019   Intrapartum Procedures: Episiotomy: None [1]                                         Lacerations:  None [1]  Patient had a delivery of a Viable infant. 08/28/2019  Information for the patient's newborn:  Ga[place]o, [id number][[id number]]Delivery Method: Vaginal, Spontaneous(Filed from Delivery Summary)     [name] had an uncomplicated postpartum course.  She is ambulating, tolerating a regular diet, passing flatus, and urinating well. Patient is discharged home in stable condition on 08/29/19.  Delivery time: 1:35 PM    Magnesium Sulfate received: No BMZ received: No Rhophylac:No MMR:Yes, order entered. RN to give. Transfusion:No  Physical exam  Vitals:   08/28/19 2015 08/29/19 0017 08/29/19 0450 08/29/19 1413  BP: 109/69 110/70 95/63 (!) 106/57  Pulse: 66 70 70 68  Resp: 18 16 16 18   Temp: 98.7 F (37.1 C) 98.3 F (36.8 C) 98.2 F (36.8 C) 98.3 F (36.8 C)  TempSrc: Oral Oral Oral Oral  SpO2: 99% 99% 100%   Weight:      Height:        Obtained by N. Sebastien Jackson, NP   08/29/2019 at 1126 General: alert, cooperative and no distress Lochia: appropriate Uterine Fundus: firm Incision: N/A DVT Evaluation: No evidence of DVT seen on physical exam. No cords or calf tenderness. No significant calf/ankle edema.  Labs: Lab Results  Component Value Date   WBC 14.3 (H) 08/28/2019   HGB 13.7 08/28/2019   HCT 40.4 08/28/2019   MCV 90.0 08/28/2019   PLT 156 08/28/2019   No flowsheet data found.  Discharge instruction: per After Visit Summary and "Baby and Me Booklet".  After visit meds:  Allergies as of 08/29/2019   No Known Allergies     Medication List    STOP taking these medications   docusate sodium 100 MG capsule Commonly known as: COLACE   ferrous sulfate 325 (65 FE) MG tablet   prenatal vitamin w/FE, FA 27-1 MG Tabs tablet     TAKE these  medications   acetaminophen 325 MG tablet Commonly known as: Tylenol Take 2 tablets (650 mg total) by mouth every 6 (six) hours as needed (for pain scale < 4).   ibuprofen 600 MG tablet Commonly known as: ADVIL Take 1 tablet (600 mg total) by mouth every 6 (six) hours as needed. What changed:   when to take this  reasons to take this   senna 8.6 MG Tabs tablet Commonly known as: SENOKOT Take 1 tablet (8.6 mg total) by mouth daily as needed for mild constipation.       Diet: routine diet  Activity: Advance as tolerated. Pelvic rest for 6 weeks.   Outpatient follow up:4 weeks Follow up Appt:No future appointments. Follow up Visit: [facility] patient Planned birth control: condoms  Newborn Data: Live born female  Birth Weight:  6 lb 4.9 oz APGAR: 9, 9  Newborn Delivery   Birth date/time: 08/28/2019 13:35:00 Delivery type: Vaginal, Spontaneous      Baby Feeding: Breast Disposition:home with mother  Carollee Leitz, Resident  I confirm that I have verified the information documented in the resident's note and that I have also personally reperformed the history, physical exam and all  medical decision making activities of this service and have verified that all service and findings are accurately documented in this student's note.  Clarisa Fling, NP 08/29/2019 7:41 PM

## 2019-08-28 NOTE — H&P (Addendum)
[place] Emily Finley is a 23 y.o. female G2P1001 with IUP at 39.0 weks by LMP c/w first trimester ultrasound presenting for labor. Pt reports contractions started at 0200. She denies LOF or vaginal bleeding. Good fetal movement  She received her prenatal care at [facility].   Ultrasounds . Anatomy U/S: normal   Prenatal History/Complications: . None  Past Medical History: Past Medical History:  Diagnosis Date  . Medical history non-contributory     Past Surgical History: Past Surgical History:  Procedure Laterality Date  . NO PAST SURGERIES      Obstetrical History: OB History    Gravida  2   Para  1   Term  1   Preterm      AB      Living  1     SAB      TAB      Ectopic      Multiple  0   Live Births  1           Social History: Social History   Socioeconomic History  . Marital status: Single    Spouse name: Not on file  . Number of children: Not on file  . Years of education: Not on file  . Highest education level: Not on file  Occupational History  . Not on file  Social Needs  . Financial resource strain: Not on file  . Food insecurity    Worry: Not on file    Inability: Not on file  . Transportation needs    Medical: Not on file    Non-medical: Not on file  Tobacco Use  . Smoking status: Never Smoker  . Smokeless tobacco: Never Used  Substance and Sexual Activity  . Alcohol use: No  . Drug use: No  . Sexual activity: Yes  Lifestyle  . Physical activity    Days per week: Not on file    Minutes per session: Not on file  . Stress: Not on file  Relationships  . Social Herbalist on phone: Not on file    Gets together: Not on file    Attends religious service: Not on file    Active member of club or organization: Not on file    Attends meetings of clubs or organizations: Not on file    Relationship status: Not on file  Other Topics Concern  . Not on file  Social History  Narrative  . Not on file    Family History: No family history on file.  Allergies: No Known Allergies  Medications Prior to Admission  Medication Sig Dispense Refill Last Dose  . docusate sodium (COLACE) 100 MG capsule Take 1 capsule (100 mg total) by mouth 2 (two) times daily. 10 capsule 0   . ferrous sulfate 325 (65 FE) MG tablet Take 1 tablet (325 mg total) by mouth 2 (two) times daily with a meal. 30 tablet 3   . ibuprofen (ADVIL,MOTRIN) 600 MG tablet Take 1 tablet (600 mg total) by mouth every 6 (six) hours. 30 tablet 0   . prenatal vitamin w/FE, FA (PRENATAL 1 + 1) 27-1 MG TABS tablet Take 1 tablet by mouth daily at 12 noon. Reported on 02/07/2016        Review of Systems  All systems reviewed and negative except as stated in HPI  Physical Exam  unknown if currently breastfeeding. General appearance: alert and cooperative Lungs: no respiratory distress Heart: regular rate  Abdomen: soft, non-tender; gravid  Pelvic: adequate Extremities: Homans sign is negative, no sign of DVT Presentation: cephalic Fetal monitoring:  -- FHR 135, moderate variability, + 10x10 accels, no decels Uterine activity:  -- contractions q2-3 mins  Dilation: Lip/rim Effacement (%): 100 Station: -1 Exam by:: DSimpson, CNM Student  Prenatal labs: ABO, Rh:  B positive Antibody:  Negative Rubella:  Non-Immune RPR:   Negative HBsAg:  Negative  HIV:   Non-reactive GBS:   Negative Glucola: Normal Genetic screening: Cystic Fibrosis negative, Quad Screen negative  Prenatal Transfer Tool  Maternal Diabetes: No Genetic Screening: Normal Maternal Ultrasounds/Referrals: Normal Fetal Ultrasounds or other Referrals:  None Maternal Substance Abuse:  No Significant Maternal Medications:  None Significant Maternal Lab Results: Group B Strep negative and Other: rubella non-immune  No results found for this or any previous visit (from the past 24 hour(s)).  Patient Active Problem List    Diagnosis Date Noted  . NSVD (normal spontaneous vaginal delivery) 03/22/2017  . Maternal varicella, non-immune 03/20/2017  . Normal labor 03/20/2017  . SAB (spontaneous abortion) 02/07/2016    Assessment/Plan:  Emily Finley is a 23 y.o. G2P1001 at 39.0 weeks here for spontaneous onset of labor.  Labor:  -- Expectant management. Patient 9.5/100/+1. Anticipate SVD -- GBS negative -- Pain control: none  Postpartum Planning -- Breast/bottle -- Contraception: condoms -- Circumcision: no  -- Needs PP MMR   Maryagnes Amos, SNM 11:22 AM  I confirm that I have verified the information documented in the nurse midwife student's note and that I have also personally reperformed the history, physical exam and all medical decision making activities of this service and have verified that all service and findings are accurately documented in this student's note.    Wende Mott, [place] 08/28/2019 2:39 PM

## 2019-08-28 NOTE — MAU Note (Addendum)
Patient taken directly from lobby to [facility] room as registration called that patient was uncomfortable. EFM 140's. [facility] interpreter called; unavailable at this time. Patient desires husband to interpret until interpreter available. VE 9.5cm/100/plus 1. Gavin Pound CNM called to bedside to evaluate for transport to LD.   Maryagnes Amos CNM-student and Len Blalock CNM notified of admission. At bedside for transport via stretcher. Patient transferred to admit bed. Covid swab collected.

## 2019-08-29 ENCOUNTER — Encounter (HOSPITAL_COMMUNITY): Payer: Self-pay | Admitting: *Deleted

## 2019-08-29 MED ORDER — SENNA 8.6 MG PO TABS: 1.0000 | ORAL_TABLET | Freq: Every day | ORAL | 0 refills | Status: DC | PRN

## 2019-08-29 MED ORDER — ACETAMINOPHEN 325 MG PO TABS: 650.0000 mg | ORAL_TABLET | Freq: Four times a day (QID) | ORAL | 0 refills | Status: AC | PRN

## 2019-08-29 MED ORDER — ACETAMINOPHEN 325 MG PO TABS: 650.0000 mg | ORAL_TABLET | ORAL | 0 refills | Status: DC | PRN

## 2019-08-29 MED ORDER — IBUPROFEN 600 MG PO TABS: 600.0000 mg | ORAL_TABLET | Freq: Four times a day (QID) | ORAL | 0 refills | Status: DC

## 2019-08-29 MED ORDER — IBUPROFEN 600 MG PO TABS: 600.0000 mg | ORAL_TABLET | Freq: Four times a day (QID) | ORAL | 0 refills | Status: DC | PRN

## 2019-08-29 MED ORDER — SENNOSIDES-DOCUSATE SODIUM 8.6-50 MG PO TABS: 2.0000 | ORAL_TABLET | ORAL | 0 refills | Status: DC

## 2019-08-29 NOTE — Progress Notes (Signed)
Post Partum Day 1  Subjective: no complaints, up ad lib, voiding, tolerating PO, + flatus and +BM since delivery.  Objective: Blood pressure 95/63, pulse 70, temperature 98.2 F (36.8 C), temperature source Oral, resp. rate 16, height 5' 6"  (1.676 m), weight 68 kg, last menstrual period 11/29/2018, SpO2 100 %, unknown if currently breastfeeding.  Patient Vitals for the past 24 hrs:  BP Temp Temp src Pulse Resp SpO2 Height Weight  08/29/19 0450 95/63 98.2 F (36.8 C) Oral 70 16 100 % - -  08/29/19 0017 110/70 98.3 F (36.8 C) Oral 70 16 99 % - -  08/28/19 2015 109/69 98.7 F (37.1 C) Oral 66 18 99 % - -  08/28/19 1617 (!) 108/57 98.2 F (36.8 C) Oral 66 18 98 % - -  08/28/19 1513 115/62 98.2 F (36.8 C) Oral 64 18 98 % - -  08/28/19 1445 (!) 114/56 - - 80 20 - - -  08/28/19 1430 118/60 - - 73 20 - - -  08/28/19 1415 124/63 - - 81 - - - -  08/28/19 1400 121/63 - - 81 - - - -  08/28/19 1346 116/65 (!) 97.4 F (36.3 C) Axillary 74 18 - - -  08/28/19 1339 121/61 - - 88 20 - - -  08/28/19 1309 114/68 (!) 97.3 F (36.3 C) Axillary 98 18 - - -  08/28/19 1149 (!) 112/59 - - 79 18 - - -  08/28/19 1140 - - - - - - 5' 6"  (1.676 m) 68 kg  08/28/19 1132 118/64 97.9 F (36.6 C) Axillary 97 18 - - -   Physical Exam:  General: alert, cooperative and no distress Lochia: appropriate Uterine Fundus: firm Incision: N/A DVT Evaluation: No evidence of DVT seen on physical exam. No cords or calf tenderness. No significant calf/ankle edema.  Recent Labs    08/28/19 1143  HGB 13.7  HCT 40.4    Assessment/Plan: Plan for discharge tomorrow  Contraception, condoms Needs pp MMR prior to discharge  Spanish interpretation services used for entire visit.   LOS: 1 day   Gerrie Nordmann Nugent 08/29/2019, 11:26 AM

## 2019-08-29 NOTE — Lactation Note (Signed)
This note was copied from a baby's chart. Lactation Consultation Note  Patient Name: Emily Finley Date: 08/29/2019 Reason for consult: Follow-up assessment;Term  45 hours old FT female who is being exclusively BF by his mother, she's a P2 and experienced BF. Mom and baby might be going home today, staff is waiting for 24 hours newborn screening prior doing baby's discharge. He's at 4% weight loss, per mom baby is already cluster feeding and cueing all the time; mom knows to feed baby on cues.  Offered assistance with latch but mom politely declined, she stated baby already fed. Asked mom to call for assistance when needed, especially since she mentioned that her nipples started getting a little sensitive in the beginning of the feeding.   LC reviewed hand expression with mom and colostrum was flowing easily of both breasts, even the right one (the "challenging one" per mom), mom able to do teach back for hand expression, colostrum containers provided; LC rubbed some colostrum on baby's mouth but he didn't wake up. Both nipples looked intact upon examination, she might be experiencing transient soreness.  Reviewed prevention/treatment for sore nipples, engorgement prevention/treatment, discharge instructions and red flags on when to call baby's pediatrician. Parents reported all questions and concerns were answered, they're both aware of [place] OP services and will call PRN.  Maternal Data    Feeding Feeding Type: Breast Fed  LATCH Score                   Interventions Interventions: Breast feeding basics reviewed;Hand express;Breast massage;Breast compression  Lactation Tools Discussed/Used     Consult Status Consult Status: Complete Date: 08/29/19 Follow-up type: Call as needed    [place] 08/29/2019, 12:57 PM

## 2021-10-18 ENCOUNTER — Encounter (HOSPITAL_COMMUNITY): Payer: Self-pay

## 2021-10-18 ENCOUNTER — Inpatient Hospital Stay (HOSPITAL_COMMUNITY): Payer: Self-pay

## 2021-10-18 ENCOUNTER — Other Ambulatory Visit: Payer: Self-pay

## 2021-10-18 ENCOUNTER — Inpatient Hospital Stay (HOSPITAL_COMMUNITY)
Admission: EM | Admit: 2021-10-18 | Discharge: 2021-10-18 | Disposition: A | Discharge status: Home / Self Care | Payer: Self-pay | Attending: Obstetrics & Gynecology | Admitting: Obstetrics & Gynecology

## 2021-10-18 DIAGNOSIS — R109 Unspecified abdominal pain: Secondary | ICD-10-CM

## 2021-10-18 DIAGNOSIS — Z8719 Personal history of other diseases of the digestive system: Secondary | ICD-10-CM | POA: Insufficient documentation

## 2021-10-18 DIAGNOSIS — R1032 Left lower quadrant pain: Secondary | ICD-10-CM | POA: Insufficient documentation

## 2021-10-18 DIAGNOSIS — O26899 Other specified pregnancy related conditions, unspecified trimester: Secondary | ICD-10-CM

## 2021-10-18 DIAGNOSIS — Z3A01 Less than 8 weeks gestation of pregnancy: Secondary | ICD-10-CM | POA: Insufficient documentation

## 2021-10-18 DIAGNOSIS — O26891 Other specified pregnancy related conditions, first trimester: Secondary | ICD-10-CM | POA: Insufficient documentation

## 2021-10-18 LAB — LIPASE, BLOOD: Lipase: 40 U/L (ref 11–51)

## 2021-10-18 LAB — URINALYSIS, ROUTINE W REFLEX MICROSCOPIC
Bilirubin Urine: NEGATIVE
Glucose, UA: NEGATIVE mg/dL
Hgb urine dipstick: NEGATIVE
Ketones, ur: NEGATIVE mg/dL
Leukocytes,Ua: NEGATIVE
Nitrite: NEGATIVE
Protein, ur: NEGATIVE mg/dL
Specific Gravity, Urine: 1.024 (ref 1.005–1.030)
pH: 6 (ref 5.0–8.0)

## 2021-10-18 LAB — COMPREHENSIVE METABOLIC PANEL
ALT: 12 U/L (ref 0–44)
AST: 16 U/L (ref 15–41)
Albumin: 3.6 g/dL (ref 3.5–5.0)
Alkaline Phosphatase: 41 U/L (ref 38–126)
Anion gap: 7 (ref 5–15)
BUN: 12 mg/dL (ref 6–20)
CO2: 23 mmol/L (ref 22–32)
Calcium: 9.1 mg/dL (ref 8.9–10.3)
Chloride: 105 mmol/L (ref 98–111)
Creatinine, Ser: 0.45 mg/dL (ref 0.44–1.00)
GFR, Estimated: >60 mL/min (ref >60)
Glucose, Bld: 96 mg/dL (ref 70–99)
Potassium: 3.7 mmol/L (ref 3.5–5.1)
Sodium: 135 mmol/L (ref 135–145)
Total Bilirubin: 0.4 mg/dL (ref 0.3–1.2)
Total Protein: 7 g/dL (ref 6.5–8.1)

## 2021-10-18 LAB — CBC
HCT: 35.1 % — ABNORMAL LOW (ref 36.0–46.0)
Hemoglobin: 11.8 g/dL — ABNORMAL LOW (ref 12.0–15.0)
MCH: 29.5 pg (ref 26.0–34.0)
MCHC: 33.6 g/dL (ref 30.0–36.0)
MCV: 87.8 fL (ref 80.0–100.0)
Platelets: 159 10*3/uL (ref 150–400)
RBC: 4 MIL/uL (ref 3.87–5.11)
RDW: 13.2 % (ref 11.5–15.5)
WBC: 5.8 10*3/uL (ref 4.0–10.5)
nRBC: 0 % (ref 0.0–0.2)

## 2021-10-18 LAB — HCG, QUANTITATIVE, PREGNANCY: hCG, Beta Chain, Quant, S: 11795 m[IU]/mL — ABNORMAL HIGH (ref <5)

## 2021-10-18 LAB — I-STAT BETA HCG BLOOD, ED (MC, WL, AP ONLY): I-stat hCG, quantitative: >2000 m[IU]/mL — ABNORMAL HIGH (ref <5)

## 2021-10-18 NOTE — MAU Provider Note (Signed)
History     CSN: [id number]  Arrival date and time: 10/18/21 1459   None     Chief Complaint  Patient presents with   Abdominal Pain   HPI  [name] is a 26 y.o. female [phone] @ 4163w6d here in MAU with LLQ pain. The pain started over a week ago. The pain comes and goes. She has not tried anything for the pain. The pain does not radiate. She has no bleeding or discharge. Hx of constipation.   Spanish interpretor at beside.   OB History     Gravida  4   Para  2   Term  2   Preterm      AB      Living  2      SAB      IAB      Ectopic      Multiple  0   Live Births  2           Past Medical History:  Diagnosis Date   Medical history non-contributory     Past Surgical History:  Procedure Laterality Date   NO PAST SURGERIES      History reviewed. No pertinent family history.  Social History   Tobacco Use   Smoking status: Never   Smokeless tobacco: Never  Substance Use Topics   Alcohol use: No   Drug use: No    Allergies: No Known Allergies  Medications Prior to Admission  Medication Sig Dispense Refill Last Dose   acetaminophen (TYLENOL) 325 MG tablet Take 2 tablets (650 mg total) by mouth every 6 (six) hours as needed (for pain scale < 4). 30 tablet 0    ibuprofen (ADVIL) 600 MG tablet Take 1 tablet (600 mg total) by mouth every 6 (six) hours as needed. 30 tablet 0    senna (SENOKOT) 8.6 MG TABS tablet Take 1 tablet (8.6 mg total) by mouth daily as needed for mild constipation. 30 tablet 0    Results for orders placed or performed during the hospital encounter of 10/18/21 (from the past 72 hour(s))  Urinalysis, Routine w reflex microscopic     Status: None   Collection Time: 10/18/21  2:59 PM  Result Value Ref Range   Color, Urine YELLOW YELLOW   APPearance CLEAR CLEAR   Specific Gravity, Urine 1.024 1.005 - 1.030   pH 6.0 5.0 - 8.0   Glucose, UA NEGATIVE NEGATIVE mg/dL   Hgb urine dipstick NEGATIVE NEGATIVE    Bilirubin Urine NEGATIVE NEGATIVE   Ketones, ur NEGATIVE NEGATIVE mg/dL   Protein, ur NEGATIVE NEGATIVE mg/dL   Nitrite NEGATIVE NEGATIVE   Leukocytes,Ua NEGATIVE NEGATIVE    Comment: Performed at [facility], [address] [place], [place] [address]  Lipase, blood     Status: None   Collection Time: 10/18/21  3:23 PM  Result Value Ref Range   Lipase 40 11 - 51 U/L    Comment: Performed at [facility], [address] [place], [place] [address]  Comprehensive metabolic panel     Status: None   Collection Time: 10/18/21  3:23 PM  Result Value Ref Range   Sodium 135 135 - 145 mmol/L   Potassium 3.7 3.5 - 5.1 mmol/L   Chloride 105 98 - 111 mmol/L   CO2 23 22 - 32 mmol/L   Glucose, Bld 96 70 - 99 mg/dL    Comment: Glucose reference range applies only to samples taken after fasting for  at least 8 hours.   BUN 12 6 - 20 mg/dL   Creatinine, Ser 3.15 0.44 - 1.00 mg/dL   Calcium 9.1 8.9 - 17.6 mg/dL   Total Protein 7.0 6.5 - 8.1 g/dL   Albumin 3.6 3.5 - 5.0 g/dL   AST 16 15 - 41 U/L   ALT 12 0 - 44 U/L   Alkaline Phosphatase 41 38 - 126 U/L   Total Bilirubin 0.4 0.3 - 1.2 mg/dL   GFR, Estimated >16 >07 mL/min    Comment: (NOTE) Calculated using the CKD-EPI Creatinine Equation (2021)    Anion gap 7 5 - 15    Comment: Performed at [facility], [address] [place], [place] [address]  CBC     Status: Abnormal   Collection Time: 10/18/21  3:23 PM  Result Value Ref Range   WBC 5.8 4.0 - 10.5 K/uL   RBC 4.00 3.87 - 5.11 MIL/uL   Hemoglobin 11.8 (L) 12.0 - 15.0 g/dL   HCT 26.9 (L) 48.5 - 46.2 %   MCV 87.8 80.0 - 100.0 fL   MCH 29.5 26.0 - 34.0 pg   MCHC 33.6 30.0 - 36.0 g/dL   RDW 70.3 50.0 - 93.8 %   Platelets 159 150 - 400 K/uL   nRBC 0.0 0.0 - 0.2 %    Comment: Performed at [facility], [address], [place], [place] [address]  I-Stat beta hCG blood, ED     Status: Abnormal   Collection Time: 10/18/21  3:28 PM  Result Value Ref Range   I-stat  hCG, quantitative >2,000.0 (H) <5 mIU/mL   Comment 3            Comment:   GEST. AGE      CONC.  (mIU/mL)   <=1 WEEK        5 - 50     2 WEEKS       50 - 500     3 WEEKS       100 - 10,000     4 WEEKS     1,000 - 30,000        FEMALE AND NON-PREGNANT FEMALE:     LESS THAN 5 mIU/mL   hCG, quantitative, pregnancy     Status: Abnormal   Collection Time: 10/18/21  5:11 PM  Result Value Ref Range   hCG, Beta Chain, Quant, S 11,795 (H) <5 mIU/mL    Comment:          GEST. AGE      CONC.  (mIU/mL)   <=1 WEEK        5 - 50     2 WEEKS       50 - 500     3 WEEKS       100 - 10,000     4 WEEKS     1,000 - 30,000     5 WEEKS     3,500 - 115,000   6-8 WEEKS     12,000 - 270,000    12 WEEKS     15,000 - 220,000        FEMALE AND NON-PREGNANT FEMALE:     LESS THAN 5 mIU/mL Performed at [facility], [address], [place], [place] [address]     US OB LESS THAN 14 WEEKS WITH [place] TRANSVAGINAL  Result Date: 10/18/2021 CLINICAL DATA:  Abdominal cramping EXAM: OBSTETRIC <14 WK [place] AND TRANSVAGINAL OB US TECHNIQUE: Both  transabdominal and transvaginal ultrasound examinations were performed for complete evaluation of the gestation as well as the maternal uterus, adnexal regions, and pelvic cul-de-sac. Transvaginal technique was performed to assess early pregnancy. COMPARISON:  None. FINDINGS: Intrauterine gestational sac: Single Yolk sac:  Visualized. Embryo:  Visualized. Cardiac Activity: Visualized. Heart Rate: 109 bpm MSD:   mm    w     d CRL:  6.1 mm   6 w   3 d                  [place] EDC: 06/10/2022 Subchorionic hemorrhage:  None visualized. Maternal uterus/adnexae: No adnexal mass or free fluid. IMPRESSION: Six week 3 day intrauterine pregnancy. Fetal heart rate 109 beats per minute. No acute maternal findings. Electronically Signed   By: Charlett Nose M.D.   On: 10/18/2021 18:57     Review of Systems  Constitutional:  Negative for fever.  Gastrointestinal:  Positive for abdominal pain and  constipation.  Genitourinary:  Negative for vaginal bleeding and vaginal discharge.  Physical Exam   Blood pressure (!) 106/53, pulse 63, temperature 98 F (36.7 C), temperature source Oral, resp. rate 17, height 5\' 5"  (1.651 m), weight 54.4 kg, last menstrual period 08/24/2021, SpO2 100 %, unknown if currently breastfeeding.  Physical Exam Vitals and nursing note reviewed.  Constitutional:      General: She is not in acute distress.    Appearance: She is well-developed. She is not ill-appearing, toxic-appearing or diaphoretic.  Abdominal:     Tenderness: There is abdominal tenderness in the left lower quadrant. There is no guarding or rebound.  Genitourinary:    Comments: Bimanual exam: Cervix closed, CMT  Uterus non tender, enlarged size Adnexa non tender, no masses bilaterally Chaperone present for exam.   Skin:    General: Skin is warm.  Neurological:     Mental Status: She is alert and oriented to person, place, and time.   MAU Course  Procedures  MDM   CBC, Hcg, ABO 08/26/2021 OB transvaginal    Assessment and Plan   A:  1. LLQ pain   2. Abdominal cramping affecting pregnancy   3. [redacted] weeks gestation of pregnancy   4. History of constipation      P:  Discharge home Reviewed OTC medications that are safe to use in Pregnancy for constipation Return to MAU if symptoms worsen Start prenatal care  Anna Livers, [place], NP 10/18/2021 8:24 PM

## 2021-10-18 NOTE — MAU Note (Signed)
[name] is a 26 y.o. at Unknown here in MAU reporting: for several days has been having left lower abdominal pain. States sometimes on the right side but mostly the left. No bleeding or discharge.  LMP: 08/24/21  Onset of complaint: ongoing  Pain score: 5/10  Vitals:   10/18/21 1511 10/18/21 1706  BP: 122/70 98/60  Pulse: 67 70  Resp: 14 16  Temp: 98.4 F (36.9 C) 98.2 F (36.8 C)  SpO2: 98% 100%     Lab orders placed from triage: none

## 2021-10-18 NOTE — ED Triage Notes (Signed)
Pt arrived POV from home c/o left lower abdominal pain that started last week. Pt is [redacted] weeks pregnant. Pt denies any N/V/D.

## 2021-10-18 NOTE — Discharge Instructions (Signed)

## 2021-10-18 NOTE — ED Provider Notes (Signed)
Emergency Medicine Provider OB Triage Evaluation Note  Emily Finley Emily Finley is a 26 y.o. female, [id number], at Unknown gestation who presents to the emergency department with complaints of pregnancy and abdominal pain  Review of  Systems  Positive: pain Negative: no spotting  Physical Exam  BP 122/70 (BP Location: Right Arm)    Pulse 67    Temp 98.4 F (36.9 C) (Oral)    Resp 14    Ht 5\' 5"  (1.651 m)    SpO2 98%    BMI 24.96 kg/m  General: Awake, no distress  HEENT: Atraumatic  Resp: Normal effort  Cardiac: Normal rate Abd: Nondistended, nontender  MSK: Moves all extremities without difficulty Neuro: Speech clear  Medical Decision Making  Pt evaluated for pregnancy concern and is stable for transfer to MAU. Pt is in agreement with plan for transfer.  3:51 PM Discussed with MAU APP, , who accepts patient in transfer.  Clinical Impression  No diagnosis found.     , PA-C 10/18/21 1552    10/20/21, DO 10/18/21 1558

## 2021-10-28 ENCOUNTER — Inpatient Hospital Stay (HOSPITAL_COMMUNITY)
Admission: AD | Admit: 2021-10-28 | Discharge: 2021-10-28 | Disposition: A | Discharge status: Home / Self Care | Payer: Self-pay | Attending: Obstetrics and Gynecology | Admitting: Obstetrics and Gynecology

## 2021-10-28 ENCOUNTER — Encounter (HOSPITAL_COMMUNITY): Payer: Self-pay | Admitting: Emergency Medicine

## 2021-10-28 ENCOUNTER — Other Ambulatory Visit: Payer: Self-pay

## 2021-10-28 ENCOUNTER — Inpatient Hospital Stay (HOSPITAL_COMMUNITY): Payer: Self-pay

## 2021-10-28 DIAGNOSIS — N939 Abnormal uterine and vaginal bleeding, unspecified: Secondary | ICD-10-CM

## 2021-10-28 DIAGNOSIS — O039 Complete or unspecified spontaneous abortion without complication: Secondary | ICD-10-CM

## 2021-10-28 DIAGNOSIS — O209 Hemorrhage in early pregnancy, unspecified: Secondary | ICD-10-CM | POA: Insufficient documentation

## 2021-10-28 DIAGNOSIS — Z3A01 Less than 8 weeks gestation of pregnancy: Secondary | ICD-10-CM | POA: Insufficient documentation

## 2021-10-28 HISTORY — DX: Urinary tract infection, site not specified: N39.0

## 2021-10-28 LAB — CBC
HCT: 36.8 % (ref 36.0–46.0)
Hemoglobin: 12.3 g/dL (ref 12.0–15.0)
MCH: 29 pg (ref 26.0–34.0)
MCHC: 33.4 g/dL (ref 30.0–36.0)
MCV: 86.8 fL (ref 80.0–100.0)
Platelets: 169 10*3/uL (ref 150–400)
RBC: 4.24 MIL/uL (ref 3.87–5.11)
RDW: 13.2 % (ref 11.5–15.5)
WBC: 7.3 10*3/uL (ref 4.0–10.5)
nRBC: 0 % (ref 0.0–0.2)

## 2021-10-28 LAB — WET PREP, GENITAL
Clue Cells Wet Prep HPF POC: NONE SEEN
Sperm: NONE SEEN
Trich, Wet Prep: NONE SEEN
WBC, Wet Prep HPF POC: <10 (ref <10)
Yeast Wet Prep HPF POC: NONE SEEN

## 2021-10-28 MED ORDER — ACETAMINOPHEN-CODEINE #3 300-30 MG PO TABS: 1.0000 | ORAL_TABLET | Freq: Four times a day (QID) | ORAL | 0 refills | Status: DC | PRN

## 2021-10-28 MED ORDER — PROMETHAZINE HCL 12.5 MG PO TABS: 12.5000 mg | ORAL_TABLET | Freq: Four times a day (QID) | ORAL | 0 refills | Status: DC | PRN

## 2021-10-28 MED ORDER — MISOPROSTOL 200 MCG PO TABS: 800.0000 ug | ORAL_TABLET | Freq: Four times a day (QID) | ORAL | 1 refills | Status: DC

## 2021-10-28 NOTE — ED Triage Notes (Signed)
Pt reports she is [redacted]wks pregnant, c/o vaginal bleeding x 1 week. Was seen for same, reports bleeding has increased and c/o abdominal cramping and low back pain.

## 2021-10-28 NOTE — MAU Provider Note (Signed)
Patient Emily Finley is a 26 y.o. [phone]  At [redacted]w[redacted]d here with complaints of vaginal bleeding and back and abdominal pain. She denies nausea, vomiting, dysuria, fever, SOB, chest pain. She reports she started bleeding 6 days ago. She reports that she has had a miscarriage in the past.  History     CSN: [id number]  Arrival date and time: 10/28/21 1437   Event Date/Time   First Provider Initiated Contact with Patient 10/28/21 1603      Chief Complaint  Patient presents with   Vaginal Bleeding   Abdominal Pain   Back Pain   Vaginal Bleeding The patient's primary symptoms include vaginal bleeding. This is a new problem. The current episode started in the past 7 days. The problem occurs constantly. The problem has been gradually worsening. Associated symptoms include back pain. The vaginal discharge was bloody. The vaginal bleeding is typical of menses. She has been passing clots. She has not been passing tissue. Her past medical history is significant for miscarriage. There is no history of a Cesarean section.   OB History     Gravida  4   Para  2   Term  2   Preterm      AB  1   Living  2      SAB  1   IAB      Ectopic      Multiple  0   Live Births  2           Past Medical History:  Diagnosis Date   Medical history non-contributory    UTI (urinary tract infection)     Past Surgical History:  Procedure Laterality Date   NO PAST SURGERIES      Family History  Problem Relation Age of Onset   Healthy Mother    Healthy Father     Social History   Tobacco Use   Smoking status: Never   Smokeless tobacco: Never  Vaping Use   Vaping Use: Never used  Substance Use Topics   Alcohol use: No   Drug use: No    Allergies: No Known Allergies  Medications Prior to Admission  Medication Sig Dispense Refill Last Dose   acetaminophen (TYLENOL) 325 MG tablet Take 2 tablets (650 mg total) by mouth every 6 (six) hours as needed (for pain scale <  4). 30 tablet 0 10/24/2021   Prenatal Vit-Fe Fumarate-FA (MULTIVITAMIN-PRENATAL) 27-0.8 MG TABS tablet Take 1 tablet by mouth daily at 12 noon.   10/26/2021   ibuprofen (ADVIL) 600 MG tablet Take 1 tablet (600 mg total) by mouth every 6 (six) hours as needed. 30 tablet 0    senna (SENOKOT) 8.6 MG TABS tablet Take 1 tablet (8.6 mg total) by mouth daily as needed for mild constipation. 30 tablet 0 10/26/2021    Review of Systems  Constitutional: Negative.   HENT: Negative.    Respiratory: Negative.    Cardiovascular: Negative.   Gastrointestinal: Negative.   Genitourinary:  Positive for vaginal bleeding.  Musculoskeletal:  Positive for back pain.  Neurological: Negative.   Physical Exam   Blood pressure 111/60, pulse 74, temperature 98.3 F (36.8 C), temperature source Oral, resp. rate 16, last menstrual period 08/24/2021, SpO2 100 %, unknown if currently breastfeeding.  Physical Exam Constitutional:      Appearance: She is well-developed.  Abdominal:     General: Abdomen is flat. There is no distension.     Palpations: Abdomen is soft.     Tenderness:  There is no abdominal tenderness.  Genitourinary:    Vagina: Bleeding present. No vaginal discharge.     Cervix: Normal.  Skin:    General: Skin is warm and dry.  Neurological:     General: No focal deficit present.     Mental Status: She is alert.    MAU Course  Procedures  MDM -US shows failed pregnancy   Assessment and Plan    -patient  Emily Finley 10/28/2021, 4:49 PM

## 2021-10-28 NOTE — ED Provider Triage Note (Signed)
Emergency Medicine Provider Triage Evaluation Note  Alinda Money , a 26 y.o. female  was evaluated in triage.  Pt complains of lower abdominal cramping and vaginal bleeding.  Patient is approximately [redacted] weeks pregnant, confirmed on ultrasound 2 weeks ago.  Review of Systems  Positive: Abdominal Cramping, vaginal bleeding Negative: N/A  Physical Exam  BP 109/75    Pulse 66    Temp 98.3 F (36.8 C) (Oral)    Resp 16    LMP 08/24/2021    SpO2 100%  Gen:   Awake, no distress   Resp:  Normal effort  MSK:   Moves extremities without difficulty  Other:    Medical Decision Making  Medically screening exam initiated at 3:29 PM.  Appropriate orders placed.  [place] Jeb Lever[place] Lysbeth Penner was informed that the remainder of the evaluation will be completed by another provider, this initial triage assessment does not replace that evaluation, and the importance of remaining in the ED until their evaluation is complete.  Discussed with MAU physician who accepted patient care. We will transfer to MAU   Delesa Kawa T, PA-C 10/28/21 1530

## 2021-10-28 NOTE — MAU Note (Signed)
Has been bleeding.  Started Last Tues.  In the beginning would see a little brown when she wiped.. now it is a dark red. Saw a very small clot today when she used the restroom. Cramping in lower abd  and lower back.

## 2021-10-29 ENCOUNTER — Telehealth: Payer: Self-pay

## 2021-10-29 LAB — GC/CHLAMYDIA PROBE AMP (~~LOC~~) NOT AT ARMC
Chlamydia: NEGATIVE
Comment: NEGATIVE
Comment: NORMAL
Neisseria Gonorrhea: NEGATIVE

## 2021-10-29 NOTE — Telephone Encounter (Signed)
[facility] pharmacy called to verify Cytotec prescription. Called pharmacy to clarify Cytotec prescription should be 800 mcg PO once with 1 refill.

## 2021-11-21 ENCOUNTER — Ambulatory Visit (INDEPENDENT_AMBULATORY_CARE_PROVIDER_SITE_OTHER): Payer: Self-pay

## 2021-11-21 ENCOUNTER — Other Ambulatory Visit: Payer: Self-pay

## 2021-11-21 VITALS — BP 102/69 | HR 76 | Wt 117.0 lb

## 2021-11-21 DIAGNOSIS — O039 Complete or unspecified spontaneous abortion without complication: Secondary | ICD-10-CM

## 2021-11-21 NOTE — Progress Notes (Signed)
° °  GYNECOLOGY PROGRESS NOTE  History:  26 y.o. [id number] presents to [facility] office today for follow up after SAB. She reports no problems. She denies bleeding, pain, dizziness. This was a planned pregnancy. Feels like she is healing both physically and mentally. Has good support system at home.   The following portions of the patient's history were reviewed and updated as appropriate: allergies, current medications, past family history, past medical history, past social history, past surgical history and problem list. Last pap smear on 03/2019 was normal.  Health Maintenance Due  Topic Date Due   COVID-19 Vaccine (1) Never done   HPV VACCINES (1 - 2-dose series) Never done   Hepatitis C Screening  Never done   TETANUS/TDAP  Never done   PAP-Cervical Cytology Screening  Never done   PAP SMEAR-Modifier  Never done   INFLUENZA VACCINE  Never done     Review of Systems:  Pertinent items are noted in HPI.   Objective:  Physical Exam Blood pressure 102/69, pulse 76, weight 117 lb (53.1 kg), last menstrual period 08/24/2021, unknown if currently breastfeeding. VS reviewed, nursing note reviewed,  Constitutional: well developed, well nourished, no distress HEENT: normocephalic CV: normal rate Pulm/chest wall: normal effort Breast Exam: deferred Abdomen: soft Neuro: alert and oriented x 3 Skin: warm, dry Psych: affect normal Pelvic exam: deferred   1. SAB (spontaneous abortion) - Doing well. Bleeding has stopped.  - Will repeat hcg today - Declines contraception. Would like to try for pregnancy in future.  - Beta hCG quant (ref lab)   Brand Males, CNM 11/21/21 2:46 PM

## 2021-11-21 NOTE — Progress Notes (Signed)
Patient stated that she doing well and has not been having any vaginal bleeding. She informed me that her recent miscarriage was a planned pregnancy and declined birth control.   [name] plans to start trying for another baby.    Corby Villasenor, CMA

## 2021-11-22 LAB — BETA HCG QUANT (REF LAB): hCG Quant: 2 m[IU]/mL

## 2022-10-30 IMAGING — US US OB < 14 WEEKS - US OB TV
1 series · 15 of 28 positions shown · non-contrast
Comparison: None.

CLINICAL DATA: Abdominal cramping

EXAM:
OBSTETRIC <14 WK US AND TRANSVAGINAL OB US
TECHNIQUE: Both transabdominal and transvaginal ultrasound examinations were
performed for complete evaluation of the gestation as well as the
maternal uterus, adnexal regions, and pelvic cul-de-sac.
Transvaginal technique was performed to assess early pregnancy.

[Series 1: us ob < 14 weeks - us ob tv · 15 of 69 slices shown]
[im 1/69]
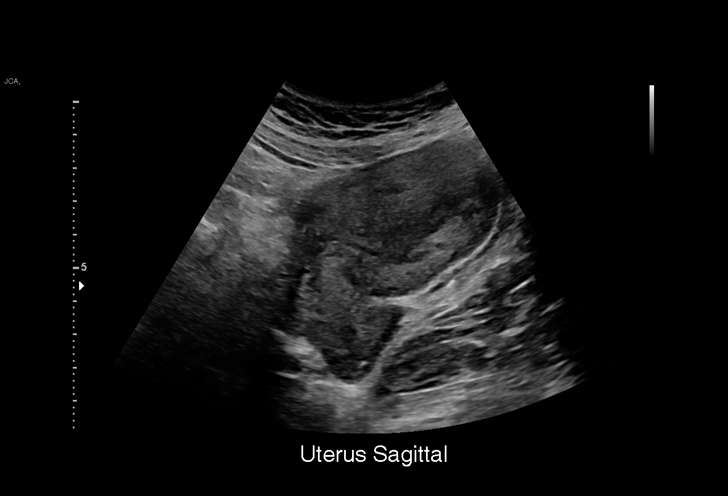
[im 6/69]
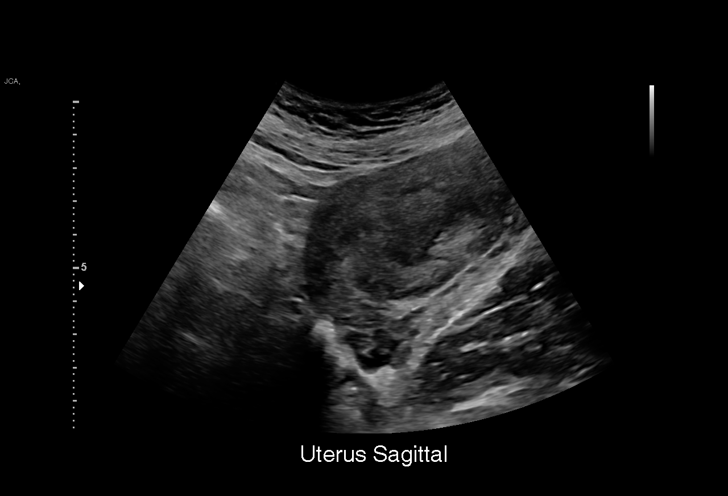
[im 11/69]
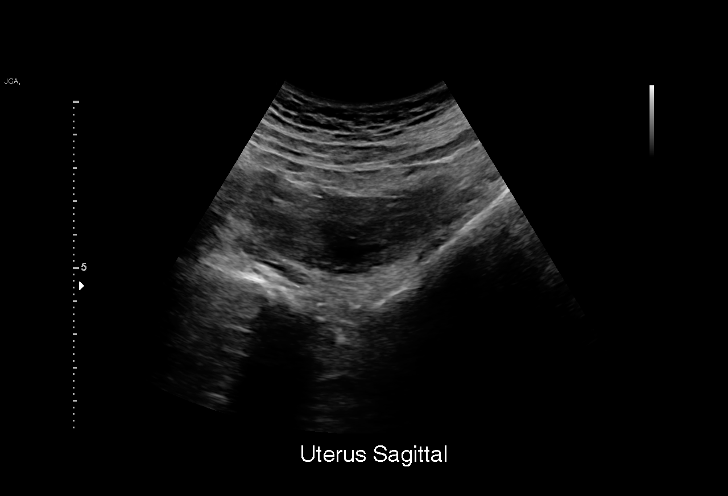
[im 16/69]
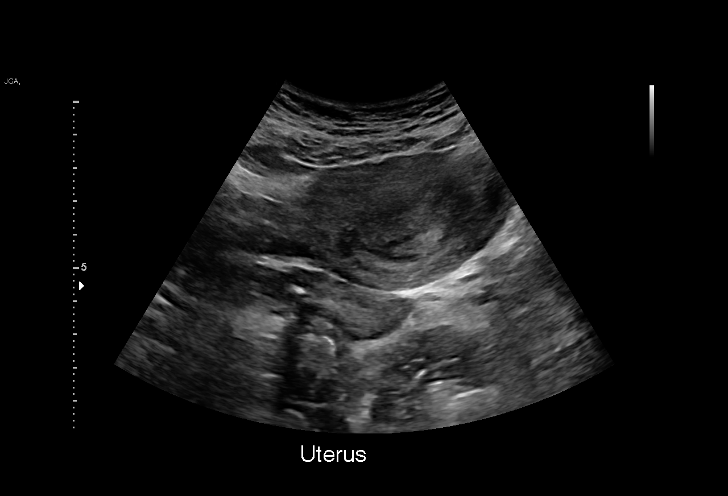
[im 21/69]
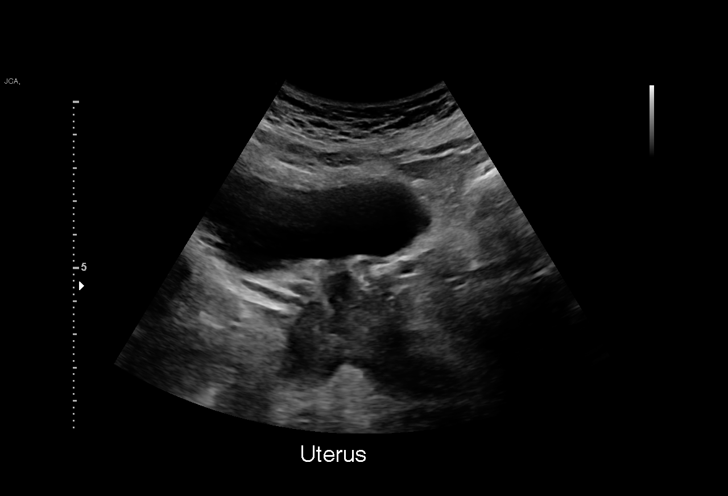
[im 26/69]
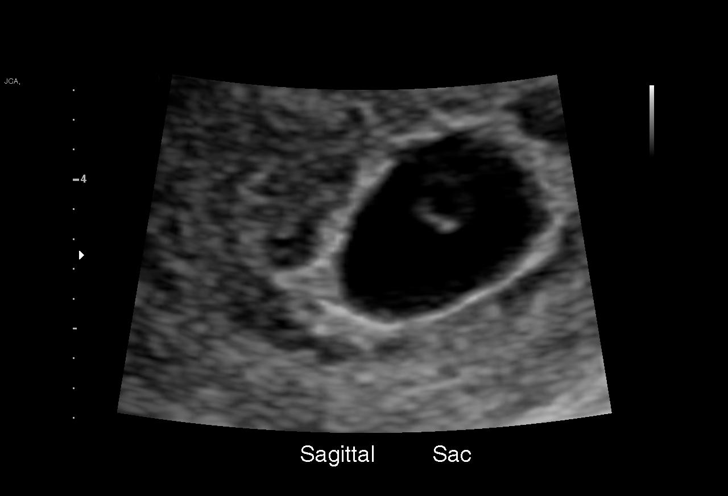
[im 31/69]
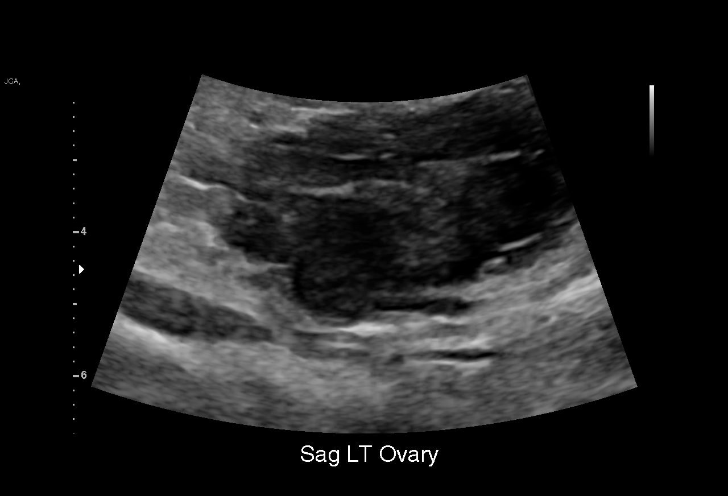
[im 36/69]
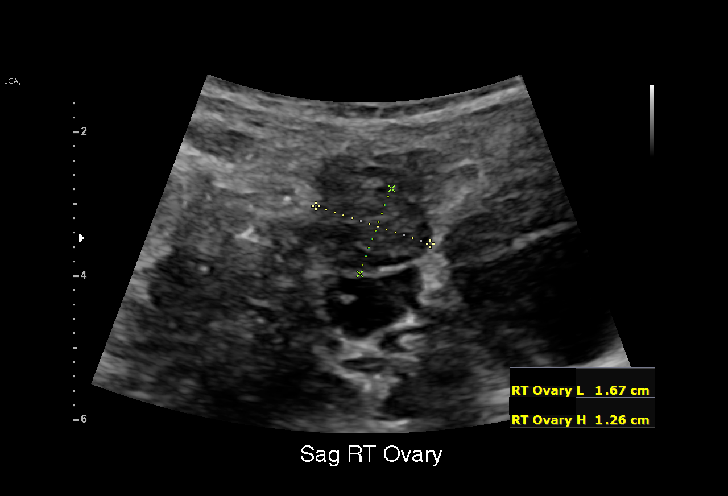
[im 38/69]
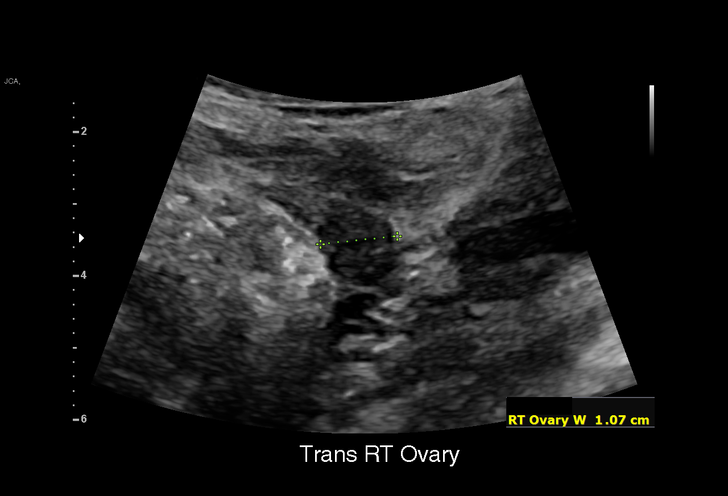
[im 43/69]
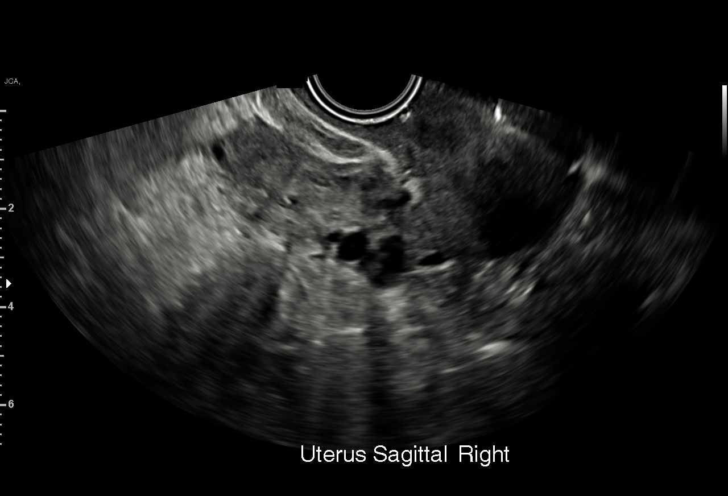
[im 48/69]
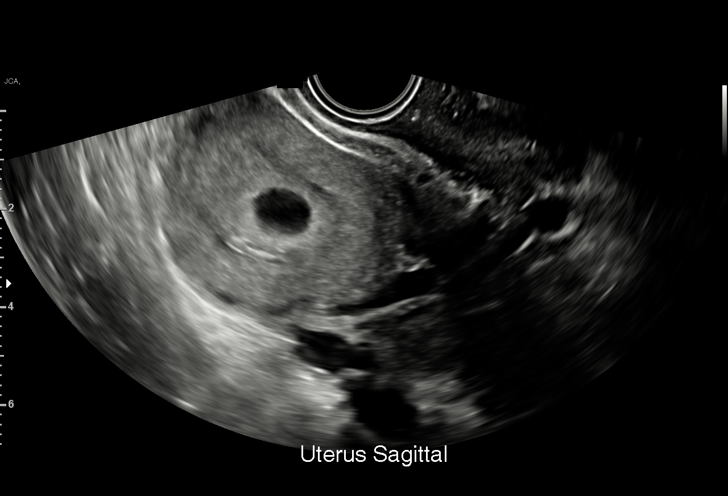
[im 53/69]
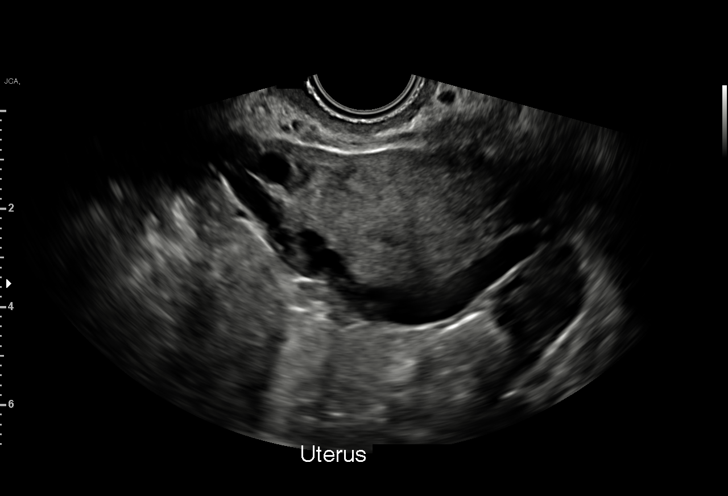
[im 58/69]
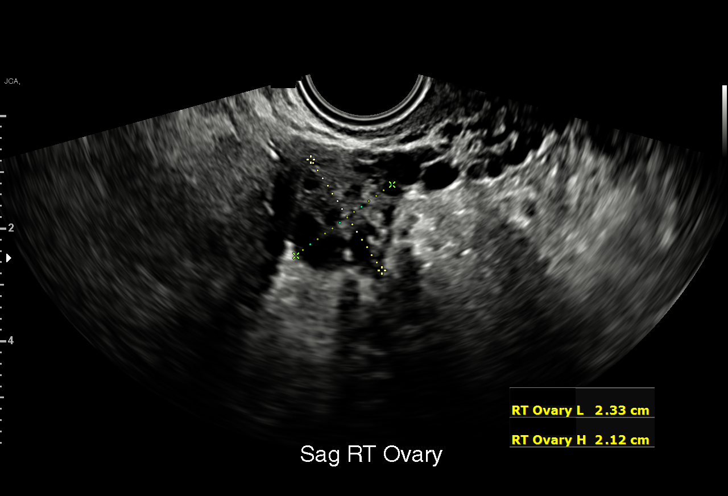
[im 63/69]
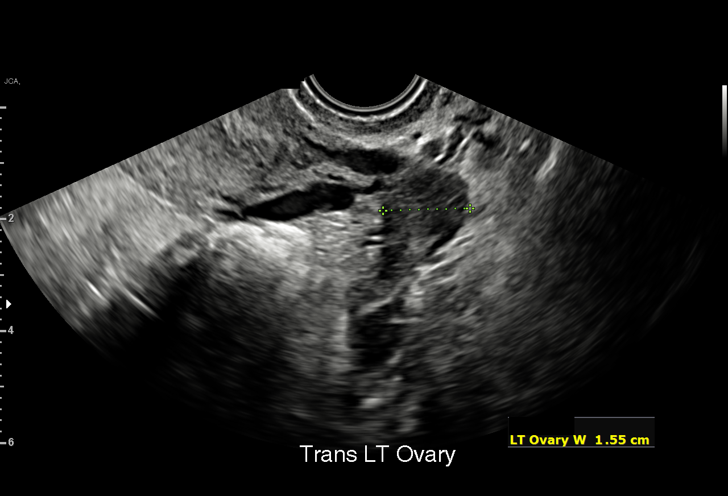
[im 69/69]
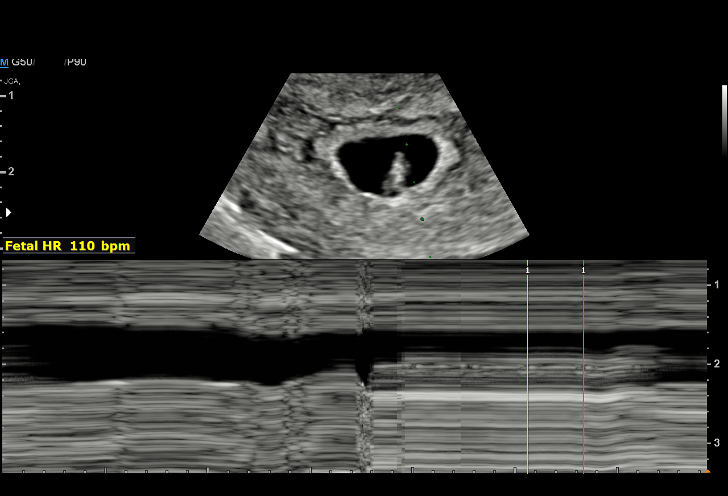

[15 of 28 positions shown; findings below may reference images not displayed]

FINDINGS: Intrauterine gestational sac: Single

Yolk sac:  Visualized.

Embryo:  Visualized.

Cardiac Activity: Visualized.

Heart Rate: 109 bpm

MSD:   mm    w     d

CRL:  6.1 mm   6 w   3 d                  US EDC: 06/10/2022

Subchorionic hemorrhage:  None visualized.

Maternal uterus/adnexae: No adnexal mass or free fluid.
IMPRESSION: Six week 3 day intrauterine pregnancy. Fetal heart rate 109 beats
per minute. No acute maternal findings.

## 2022-11-09 IMAGING — US US OB TRANSVAGINAL
1 series · 15 of 28 positions shown · non-contrast
Comparison: Obstetrical ultrasound 10/18/2021.

CLINICAL DATA: Vaginal bleeding.

EXAM:
TRANSVAGINAL OB ULTRASOUND
TECHNIQUE: Transvaginal ultrasound was performed for complete evaluation of the
gestation as well as the maternal uterus, adnexal regions, and
pelvic cul-de-sac.

[Series 1: us ob transvaginal · 30 acquisitions, 15 frames shown]
[im 1/30]
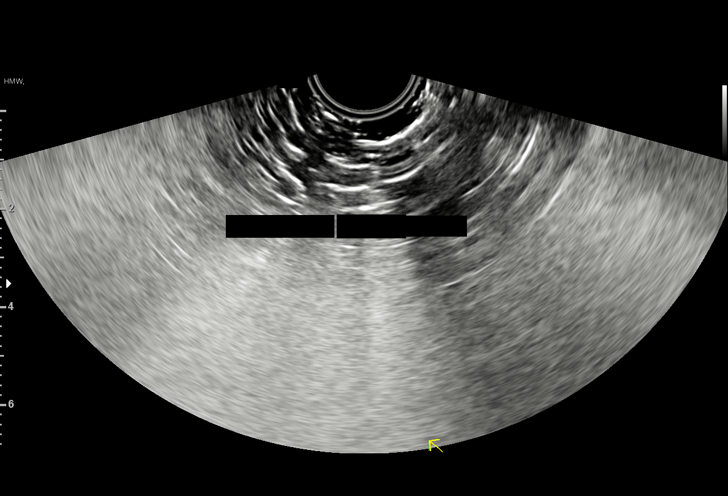
[im 3/30]
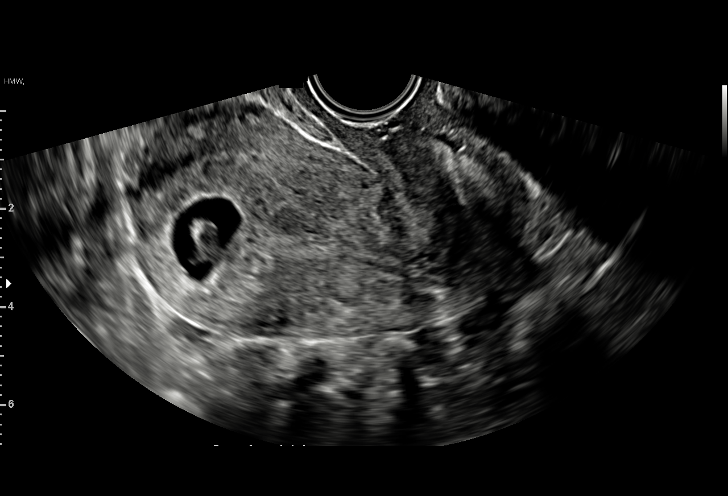
[im 5/30]
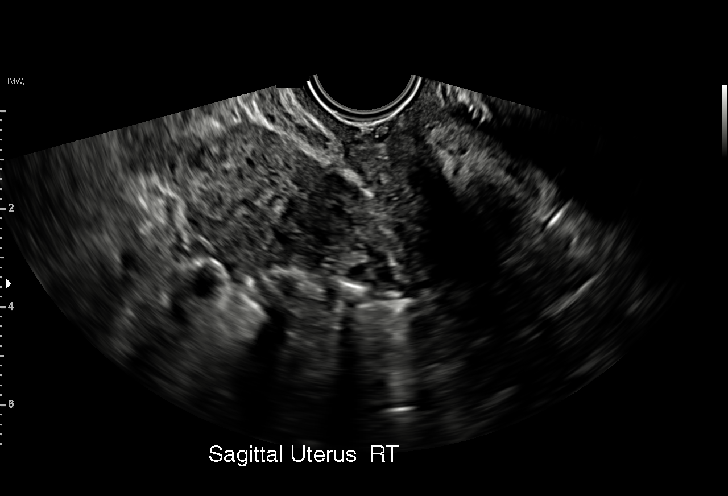
[im 7/30]
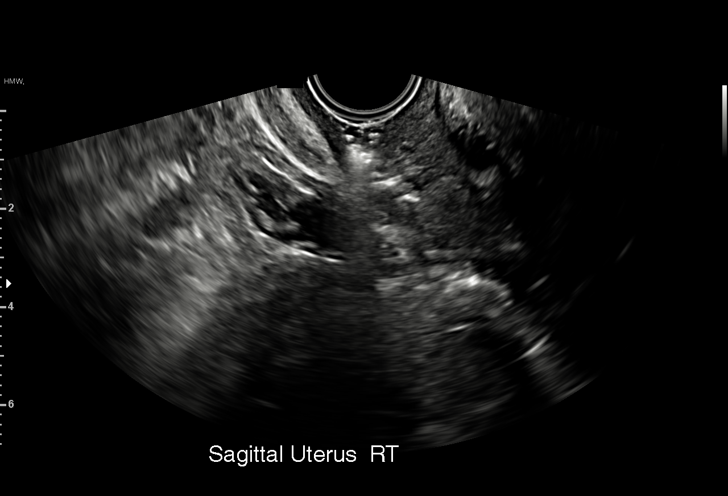
[im 9/30]
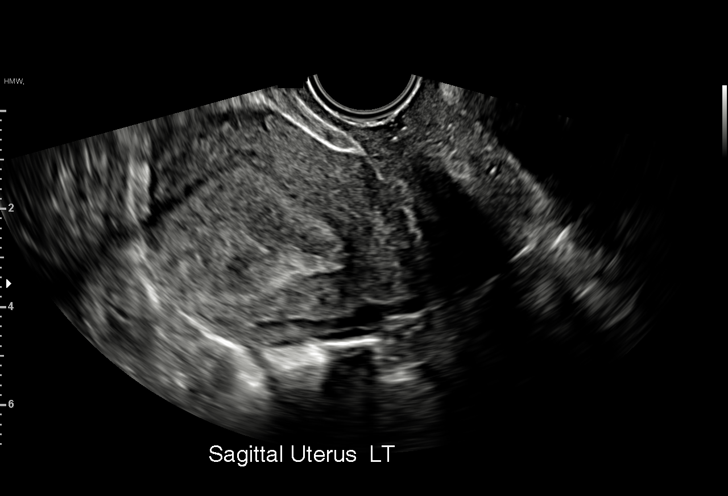
[im 11/30]
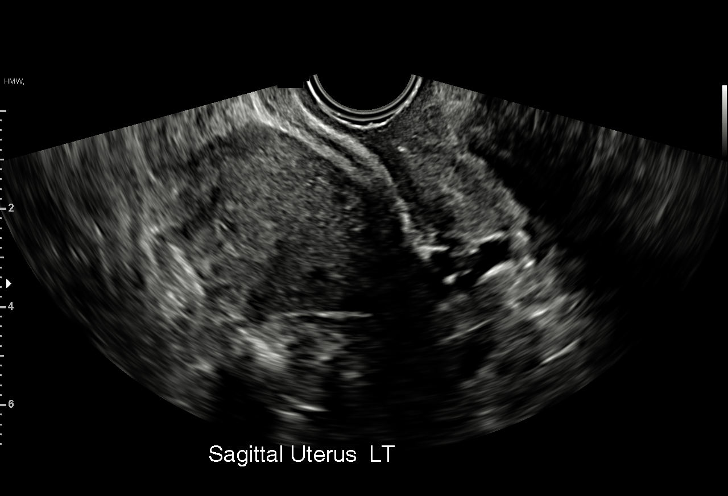
[im 13/30]
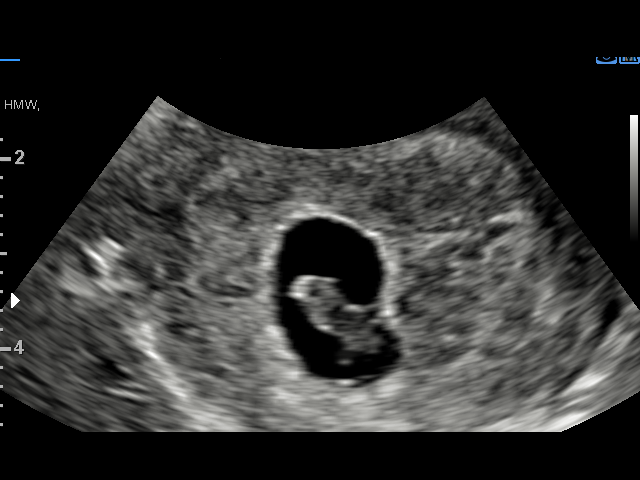
[im 16/30]
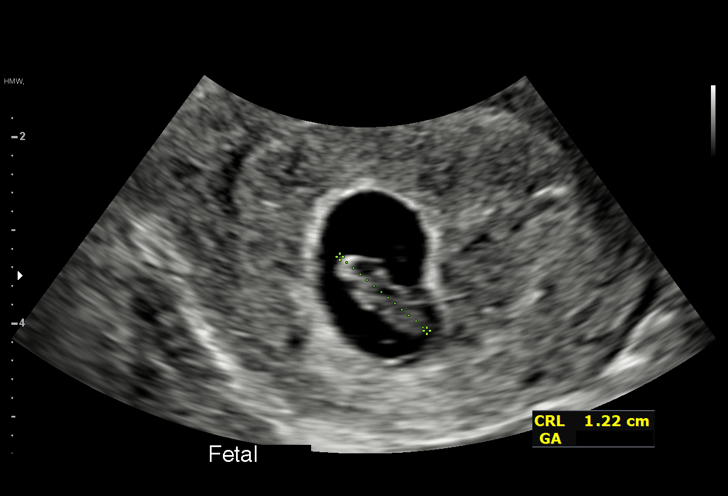
[im 17/30]
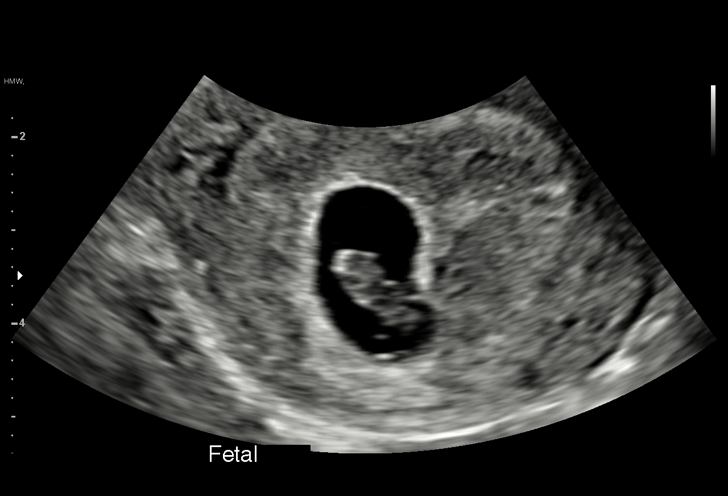
[im 19/30]
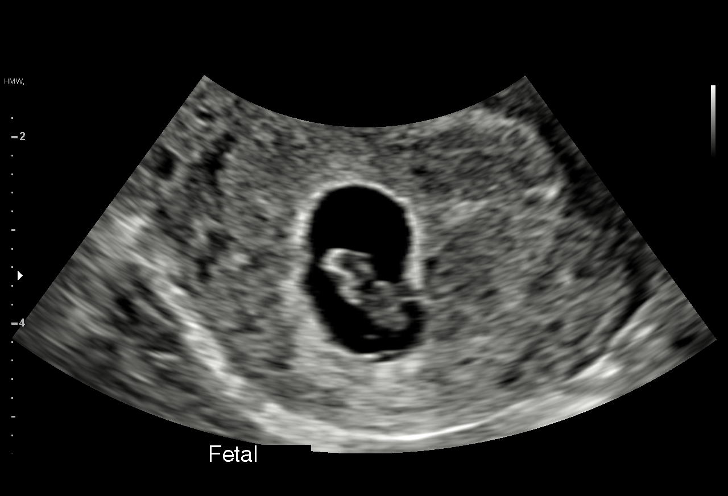
[im 21/30]
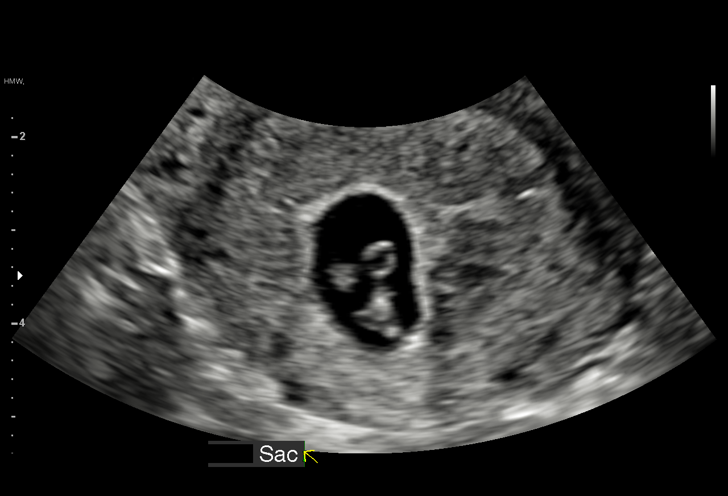
[im 23/30]
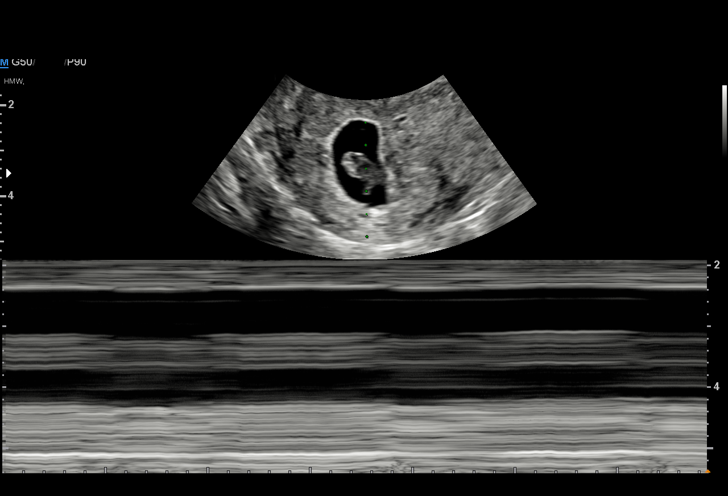
[im 25/30]
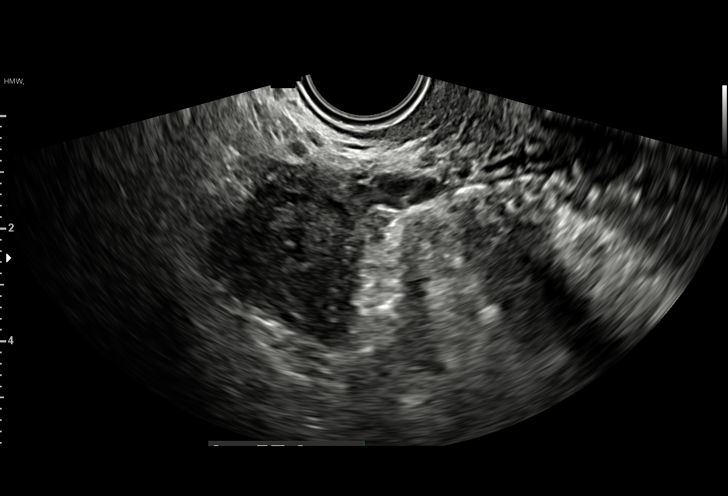
[im 27/30]
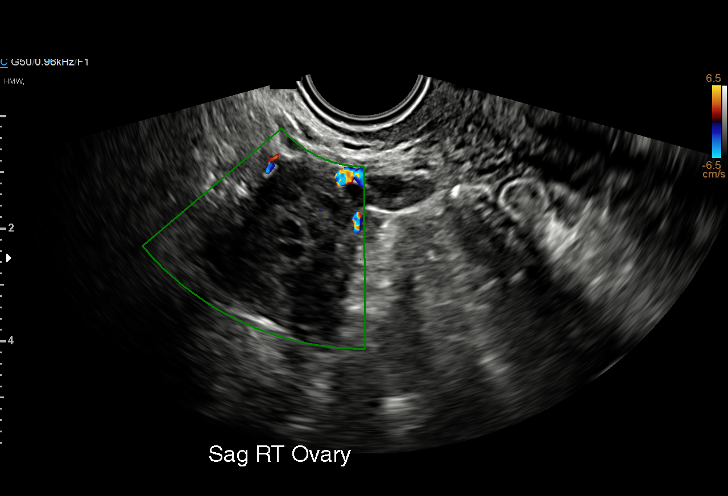
[im 30/30]
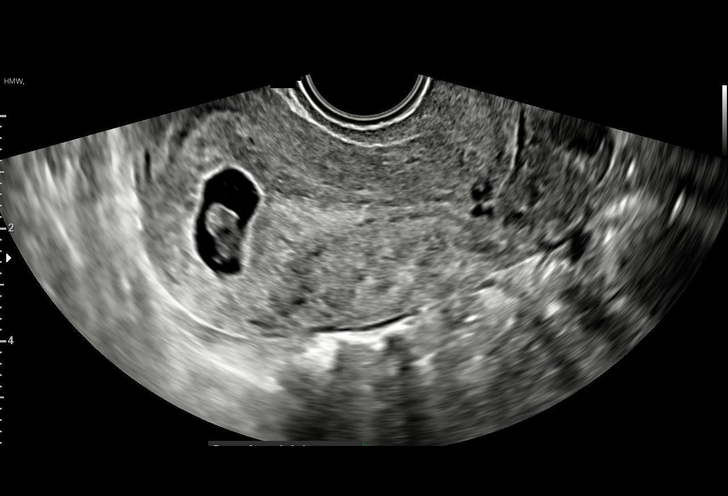

[15 of 28 positions shown; findings below may reference images not displayed]

FINDINGS: Intrauterine gestational sac: Single

Yolk sac:  Visualized.

Embryo:  Visualized.

Cardiac Activity: Not Visualized.

CRL:   11.9 mm   7 w 2 d                  US EDC: 06/14/2022

Subchorionic hemorrhage:  None visualized.

Maternal uterus/adnexae: Right ovary appears within normal limits.
Left ovary not visualized. No free fluid in the pelvis.
IMPRESSION: 1. Findings meet definitive criteria for failed pregnancy measuring
7 weeks 2 days by crown-rump length. This follows SRU consensus
guidelines: Diagnostic Criteria for Nonviable Pregnancy Early in the
First Trimester. N Engl J Med 4938;[DATE].

## 2023-01-22 ENCOUNTER — Other Ambulatory Visit: Payer: Self-pay

## 2023-01-22 DIAGNOSIS — N6320 Unspecified lump in the left breast, unspecified quadrant: Secondary | ICD-10-CM

## 2023-03-05 ENCOUNTER — Ambulatory Visit
Admission: RE | Admit: 2023-03-05 | Discharge: 2023-03-05 | Disposition: A | Discharge status: Home / Self Care | Payer: Medicaid Other | Source: Ambulatory Visit | Attending: Obstetrics and Gynecology | Admitting: Obstetrics and Gynecology

## 2023-03-05 ENCOUNTER — Ambulatory Visit: Payer: Medicaid Other | Admitting: Hematology and Oncology

## 2023-03-05 VITALS — BP 105/78 | Wt 118.0 lb

## 2023-03-05 DIAGNOSIS — N6320 Unspecified lump in the left breast, unspecified quadrant: Secondary | ICD-10-CM

## 2023-03-05 NOTE — Progress Notes (Signed)
Ms. Daniesha Winford Lysbeth Penner is a 27 y.o. female who presents to [facility] clinic today with complaint of left breast mass.    Pap Smear: Pap not smear completed today. Last Pap smear was 01/2023 at [facility] clinic and was normal. Per patient has no history of an abnormal Pap smear. Last Pap smear result is available in Epic.   Physical exam: Breasts Breasts symmetrical. No skin abnormalities bilateral breasts. No nipple retraction bilateral breasts. No nipple discharge bilateral breasts. No lymphadenopathy. No lumps palpated bilateral breasts.       Pelvic/Bimanual Pap is not indicated today    Smoking History: Patient has never smoked and was not referred to quit line.    Patient Navigation: Patient education provided. Access to services provided for patient through B[facility]CP program. Natale Lay interpreter provided. No transportation provided   Colorectal Cancer Screening: Per patient has never had colonoscopy completed No complaints today.    Breast and Cervical Cancer Risk Assessment: Patient does not have family history of breast cancer, known genetic mutations, or radiation treatment to the chest before age 67. Patient does not have history of cervical dysplasia, immunocompromised, or DES exposure in-utero.  Risk Assessment   No risk assessment data     A: B[facility]CP exam without pap smear Complaint of left breast mass. Exam benign.  P: Referred patient to the [facility] for a diagnostic mammogram. Appointment scheduled 03/05/2023.  Pascal Lux, NP 03/05/2023 9:33 AM

## 2023-03-05 NOTE — Patient Instructions (Addendum)
Taught Alinda Money about self breast awareness and gave educational materials to take home. Patient did not need a Pap smear today due to last Pap smear was in 01/2023 per patient. Let her know [facility]CP will cover Pap smears every 5 years unless has a history of abnormal Pap smears. Referred patient to the [facility] for diagnostic mammogram. Appointment scheduled for 03/05/2023. Patient aware of appointment and will be there. Let patient know will follow up with her within the next couple weeks with results. [place] Jillene Bucks verbalized understanding.  Pascal Lux, NP 9:49 AM

## 2023-03-19 ENCOUNTER — Ambulatory Visit: Payer: Medicaid Other | Admitting: Obstetrics and Gynecology

## 2023-03-23 ENCOUNTER — Other Ambulatory Visit: Payer: Self-pay

## 2023-07-07 ENCOUNTER — Other Ambulatory Visit: Payer: Self-pay | Admitting: Obstetrics and Gynecology

## 2023-07-07 DIAGNOSIS — N6320 Unspecified lump in the left breast, unspecified quadrant: Secondary | ICD-10-CM

## 2023-07-27 LAB — OB RESULTS CONSOLE GC/CHLAMYDIA
Chlamydia: NEGATIVE
Neisseria Gonorrhea: NEGATIVE

## 2023-07-27 LAB — OB RESULTS CONSOLE RUBELLA ANTIBODY, IGM: Rubella: IMMUNE

## 2023-07-27 LAB — HEPATITIS C ANTIBODY: HCV Ab: NEGATIVE

## 2023-07-27 LAB — OB RESULTS CONSOLE RPR: RPR: NONREACTIVE

## 2023-07-27 LAB — OB RESULTS CONSOLE ABO/RH: RH Type: NEGATIVE

## 2023-07-27 LAB — OB RESULTS CONSOLE HEPATITIS B SURFACE ANTIGEN: Hepatitis B Surface Ag: NEGATIVE

## 2023-07-27 LAB — OB RESULTS CONSOLE HIV ANTIBODY (ROUTINE TESTING): HIV: NONREACTIVE

## 2023-09-30 NOTE — L&D Delivery Note (Signed)
 OB/GYN Faculty Practice Delivery Note  Emily  Katiana Finley is a 28 y.o. [id number] s/p SVD at [redacted]w[redacted]d. She was admitted for SOL.   ROM: 11h 50m with clear fluid GBS Status:  Negative/-- (04/30 0000) Maximum Maternal Temperature: 98.42F  Labor Progress: Initial SVE: 3/50/-2. She received pitocin  and AROM of forebag. She then progressed to complete.   Delivery Date/Time: 5/18 0408 Delivery: Called to room and patient was complete and pushing. Mom pushed with significant fore bag through introitus, bag visualized w/o cord and Cat 1 strip so ruptured. Head delivered ROA. Nuchal cord present, unable to reduce. Shoulder and body delivered in usual fashion. Nuchal easily reduced. Infant with spontaneous cry, placed on mother's abdomen, dried and stimulated. Cord clamped x 2 after 1-minute delay, and cut by FOB. Cord blood drawn. Placenta delivered spontaneously with gentle cord traction. Fundus firm with massage and Pitocin . Labia, perineum, vagina, and cervix inspected without lacerations. Mom and baby doing well.  Baby Weight: pending  Placenta: 3 vessel, intact. Sent to L&D Complications: None Lacerations: None EBL: 150 mL Analgesia: N/a  Infant:  APGAR (1 MIN): 8  APGAR (5 MINS): 9   Ebony Goldstein, MD [facility] Family Medicine Fellow, [facility], [facility] 02/14/2024, 4:20 AM

## 2024-01-27 LAB — OB RESULTS CONSOLE GBS: GBS: NEGATIVE

## 2024-02-13 ENCOUNTER — Other Ambulatory Visit: Payer: Self-pay

## 2024-02-13 ENCOUNTER — Encounter (HOSPITAL_COMMUNITY): Payer: Self-pay | Admitting: Anesthesiology

## 2024-02-13 ENCOUNTER — Encounter (HOSPITAL_COMMUNITY): Payer: Self-pay | Admitting: *Deleted

## 2024-02-13 ENCOUNTER — Inpatient Hospital Stay (HOSPITAL_COMMUNITY)
Admission: AD | Admit: 2024-02-13 | Discharge: 2024-02-15 | DRG: 807 | Disposition: A | Discharge status: Home / Self Care | Attending: Obstetrics and Gynecology | Admitting: Obstetrics and Gynecology

## 2024-02-13 DIAGNOSIS — O26893 Other specified pregnancy related conditions, third trimester: Secondary | ICD-10-CM | POA: Diagnosis present

## 2024-02-13 DIAGNOSIS — Z3A39 39 weeks gestation of pregnancy: Secondary | ICD-10-CM

## 2024-02-13 DIAGNOSIS — O4292 Full-term premature rupture of membranes, unspecified as to length of time between rupture and onset of labor: Secondary | ICD-10-CM | POA: Diagnosis present

## 2024-02-13 DIAGNOSIS — O4290 Premature rupture of membranes, unspecified as to length of time between rupture and onset of labor, unspecified weeks of gestation: Principal | ICD-10-CM | POA: Diagnosis present

## 2024-02-13 DIAGNOSIS — O4202 Full-term premature rupture of membranes, onset of labor within 24 hours of rupture: Secondary | ICD-10-CM | POA: Diagnosis not present

## 2024-02-13 LAB — CBC
HCT: 37.4 % (ref 36.0–46.0)
Hemoglobin: 12.1 g/dL (ref 12.0–15.0)
MCH: 28.5 pg (ref 26.0–34.0)
MCHC: 32.4 g/dL (ref 30.0–36.0)
MCV: 88.2 fL (ref 80.0–100.0)
Platelets: 152 10*3/uL (ref 150–400)
RBC: 4.24 MIL/uL (ref 3.87–5.11)
RDW: 15.1 % (ref 11.5–15.5)
WBC: 8.9 10*3/uL (ref 4.0–10.5)
nRBC: 0 % (ref 0.0–0.2)

## 2024-02-13 LAB — TYPE AND SCREEN
ABO/RH(D): B POS
Antibody Screen: NEGATIVE

## 2024-02-13 LAB — POCT FERN TEST: POCT Fern Test: POSITIVE

## 2024-02-13 MED ORDER — CALCIUM CARBONATE ANTACID 500 MG PO CHEW
200.0000 mg | CHEWABLE_TABLET | Freq: Two times a day (BID) | ORAL | Status: DC | PRN
  Administered 2024-02-13: 200 mg via ORAL

## 2024-02-13 MED ORDER — CALCIUM CARBONATE ANTACID 500 MG PO CHEW
1.0000 | CHEWABLE_TABLET | Freq: Every day | ORAL | Status: DC
  Administered 2024-02-14 – 2024-02-15 (×2): 200 mg via ORAL
  Filled 2024-02-13 (×3): qty 1

## 2024-02-13 MED ORDER — OXYTOCIN-SODIUM CHLORIDE 30-0.9 UT/500ML-% IV SOLN
1.0000 m[IU]/min | INTRAVENOUS | Status: DC
  Administered 2024-02-13: 2 m[IU]/min via INTRAVENOUS

## 2024-02-13 MED ORDER — OXYTOCIN-SODIUM CHLORIDE 30-0.9 UT/500ML-% IV SOLN
2.5000 [IU]/h | INTRAVENOUS | Status: DC
  Administered 2024-02-14: 2.5 [IU]/h via INTRAVENOUS
  Filled 2024-02-13: qty 500

## 2024-02-13 MED ORDER — OXYTOCIN BOLUS FROM INFUSION
333.0000 mL | Freq: Once | INTRAVENOUS | Status: AC
  Administered 2024-02-14: 333 mL via INTRAVENOUS

## 2024-02-13 MED ORDER — OXYCODONE-ACETAMINOPHEN 5-325 MG PO TABS: 2.0000 | ORAL_TABLET | ORAL | Status: DC | PRN

## 2024-02-13 MED ORDER — ONDANSETRON HCL 4 MG/2ML IJ SOLN: 4.0000 mg | Freq: Four times a day (QID) | INTRAMUSCULAR | Status: DC | PRN

## 2024-02-13 MED ORDER — TERBUTALINE SULFATE 1 MG/ML IJ SOLN: 0.2500 mg | Freq: Once | INTRAMUSCULAR | Status: DC | PRN

## 2024-02-13 MED ORDER — FENTANYL CITRATE (PF) 100 MCG/2ML IJ SOLN
50.0000 ug | INTRAMUSCULAR | Status: DC | PRN
Start: 2024-02-13 — End: 2024-02-14

## 2024-02-13 MED ORDER — LACTATED RINGERS IV SOLN: 500.0000 mL | INTRAVENOUS | Status: DC | PRN

## 2024-02-13 MED ORDER — SOD CITRATE-CITRIC ACID 500-334 MG/5ML PO SOLN: 30.0000 mL | ORAL | Status: DC | PRN

## 2024-02-13 MED ORDER — LIDOCAINE HCL (PF) 1 % IJ SOLN: 30.0000 mL | INTRAMUSCULAR | Status: DC | PRN

## 2024-02-13 MED ORDER — LACTATED RINGERS IV SOLN: INTRAVENOUS | Status: DC

## 2024-02-13 MED ORDER — ACETAMINOPHEN 325 MG PO TABS: 650.0000 mg | ORAL_TABLET | ORAL | Status: DC | PRN

## 2024-02-13 MED ORDER — OXYCODONE-ACETAMINOPHEN 5-325 MG PO TABS: 1.0000 | ORAL_TABLET | ORAL | Status: DC | PRN

## 2024-02-13 MED ORDER — HYDROXYZINE HCL 50 MG PO TABS: 50.0000 mg | ORAL_TABLET | Freq: Four times a day (QID) | ORAL | Status: DC | PRN

## 2024-02-13 NOTE — MAU Provider Note (Cosign Needed Addendum)
 Chief Complaint:  Rupture of Membranes   HPI   None     Emily  Rosaisela Finley is a 28 y.o. [id number] at [redacted]w[redacted]d who presents to maternity admissions reporting ROM at 1700. She reports a gush of clear fluid once. She endorses good fetal movement. She denies VB or contractions.   Pregnancy Course: [facility], WNL  Past Medical History:  Diagnosis Date   Medical history non-contributory    UTI (urinary tract infection)    OB History  Gravida Para Term Preterm AB Living  5 2 2  1 2   SAB IAB Ectopic Multiple Live Births  1   0 2    # Outcome Date GA Lbr Len/2nd Weight Sex Type Anes PTL Lv  5 Current           4 Term 08/28/19 [redacted]w[redacted]d 11:12 / 00:23 2860 g M Vag-Spont None  LIV  3 Term 03/20/17 [redacted]w[redacted]d 02:20 / 01:03 2980 g M Vag-Spont Local  LIV  2 SAB           1 Gravida            Past Surgical History:  Procedure Laterality Date   NO PAST SURGERIES     Family History  Problem Relation Age of Onset   Healthy Mother    Healthy Father    Social History   Tobacco Use   Smoking status: Never   Smokeless tobacco: Never  Vaping Use   Vaping status: Never Used  Substance Use Topics   Alcohol use: No   Drug use: No   No Known Allergies Medications Prior to Admission  Medication Sig Dispense Refill Last Dose/Taking   ferrous sulfate  325 (65 FE) MG EC tablet Take 325 mg by mouth 3 (three) times daily with meals.   Past Week   Prenatal Vit-Fe Fumarate-FA (MULTIVITAMIN-PRENATAL) 27-0.8 MG TABS tablet Take 1 tablet by mouth daily at 12 noon.   02/12/2024   acetaminophen  (TYLENOL ) 325 MG tablet Take 2 tablets (650 mg total) by mouth every 6 (six) hours as needed (for pain scale < 4). 30 tablet 0    ibuprofen  (ADVIL ) 600 MG tablet Take 1 tablet (600 mg total) by mouth every 6 (six) hours as needed. 30 tablet 0     I have reviewed patient's Past Medical Hx, Surgical Hx, Family Hx, Social Hx, medications and allergies.   ROS  Pertinent items noted in HPI and remainder of comprehensive ROS  otherwise negative.   PHYSICAL EXAM  Patient Vitals for the past 24 hrs:  BP Temp Temp src Pulse Resp SpO2 Height Weight  02/13/24 1840 -- -- -- -- -- 97 % -- --  02/13/24 1838 123/74 98.2 F (36.8 C) Oral 68 15 -- 5\' 6"  (1.676 m) 65.3 kg    Physical Exam Vitals and nursing note reviewed. Exam conducted with a chaperone present.  Constitutional:      Appearance: Normal appearance.  HENT:     Head: Normocephalic.  Pulmonary:     Effort: Pulmonary effort is normal.  Abdominal:     Comments: gravid  Genitourinary:    General: Normal vulva.     Exam position: Lithotomy position.     Vagina: Vaginal discharge present.     Cervix: Discharge present.     Comments: Speculum used to obtain specimens with observation of copious physiologic discharge and some mucus. Minimal fluid noted. Specimen obtained for fern testing.  Skin:    General: Skin is warm and dry.  Capillary Refill: Capillary refill takes less than 2 seconds.  Neurological:     General: No focal deficit present.     Mental Status: She is alert and oriented to person, place, and time. Mental status is at baseline.  Psychiatric:        Mood and Affect: Mood normal.        Behavior: Behavior normal.        Thought Content: Thought content normal.        Judgment: Judgment normal.       BSUS performed with vertex presentation verified.   Fetal Tracing: Baseline: 135 Variability:moderate Accelerations: 15x15 Decelerations:absent Toco: irregular   Labs: Results for orders placed or performed during the hospital encounter of 02/13/24 (from the past 24 hours)  POCT fern test     Status: Abnormal   Collection Time: 02/13/24  7:34 PM  Result Value Ref Range   POCT Fern Test Positive = ruptured amniotic membanes     Imaging:  No results found.  MDM & MAU COURSE  MDM: Rule out rupture with positive fern result.  Records reviewed.  Recommend admission to L&D.   MAU Course: Orders Placed This Encounter   Procedures   Rupture of Membrane (ROM) Plus   CBC   RPR   Diet laboring Room service appropriate? Yes   Contraction - monitoring   External fetal heart monitoring   Vitals signs per unit policy   Notify physician (specify)   Fetal monitoring per unit policy   Activity as tolerated   Cervical Exam   Measure blood pressure post delivery every 15 min x 1 hour then every 30 min x 1 hour   Fundal check post delivery every 15 min x 1 hour then every 30 min x 1 hour   Apply Labor & Delivery Care Plan   Patient may have epidural placement upon request   If Rapid HIV test positive or known HIV positive: initiate AZT orders   May in and out cath x 2 for inability to void   Insert urethral catheter X 1 PRN If Coude Catheter is chosen, qualified resources by campus can be found in the clinical skills nursing procedure for Coude Catheter 1. If straight catheterized > 2 times or patient unable to void post epidural plac...   Refer to Sidebar Report Urinary (Foley) Catheter Indications   Refer to Sidebar Report Post Indwelling Urinary Catheter Removal and Intervention Guidelines   Discontinue foley prior to vaginal delivery   Initiate Oral Care Protocol   Initiate Carrier Fluid Protocol   Full code   Nitrous Oxide 50%/Oxygen 50%   POCT fern test   Type and screen [facility]   Insert and maintain IV Line   Admit to Inpatient (patient's expected length of stay will be greater than 2 midnights or inpatient only procedure)   Meds ordered this encounter  Medications   lactated ringers  infusion   oxytocin  (PITOCIN ) IV BOLUS FROM BAG   oxytocin  (PITOCIN ) IV infusion 30 units in NS 500 mL - Premix   lactated ringers  infusion 500-1,000 mL   acetaminophen  (TYLENOL ) tablet 650 mg   oxyCODONE -acetaminophen  (PERCOCET/ROXICET) 5-325 MG per tablet 1 tablet   oxyCODONE -acetaminophen  (PERCOCET/ROXICET) 5-325 MG per tablet 2 tablet   fentaNYL  (SUBLIMAZE ) injection 50-100 mcg   hydrOXYzine  (ATARAX) tablet 50 mg   ondansetron  (ZOFRAN ) injection 4 mg   sodium citrate-citric acid  (ORACIT) solution 30 mL   lidocaine  (PF) (XYLOCAINE ) 1 % injection 30 mL  ASSESSMENT   1. Amniotic fluid leaking   2. [redacted] weeks gestation of pregnancy    BSUS performed with vertex presentation verified.   PLAN  Admit to labor and delivery. L&D team notified and handoff performed.      Raford Bunk, MSN, CNM 02/13/2024 8:22 PM  Certified Nurse Midwife, [facility]

## 2024-02-13 NOTE — H&P (Signed)
 LABOR AND DELIVERY ADMISSION HISTORY AND PHYSICAL NOTE  Emily Finley is a 28 y.o. female [id number] with IUP at [redacted]w[redacted]d presenting for SROM ~1700.   Patient reports the fetal movement as active. Patient reports uterine contraction activity as none. Patient reports vaginal bleeding as none. Patient describes fluid per vagina as Clear.   Patient denies headache, vision changes, chest pain, shortness of breath, right upper quadrant pain, or LE edema.  She plans on breast feeding. Her contraception plan is: unsure.  Prenatal History/Complications: PNC at [facility]  Sono:  @[redacted]w[redacted]d , CWD, normal anatomy, breech presentation, anterior placenta, 46%ile  Pregnancy complications:  Patient Active Problem List   Diagnosis Date Noted   Amniotic fluid leaking 02/13/2024   NSVD (normal spontaneous vaginal delivery) 03/22/2017   Maternal varicella, non-immune 03/20/2017   Normal labor 03/20/2017   SAB (spontaneous abortion) 02/07/2016    Past Medical History: Past Medical History:  Diagnosis Date   Medical history non-contributory    UTI (urinary tract infection)     Past Surgical History: Past Surgical History:  Procedure Laterality Date   NO PAST SURGERIES      Obstetrical History: OB History     Gravida  5   Para  2   Term  2   Preterm      AB  1   Living  2      SAB  1   IAB      Ectopic      Multiple  0   Live Births  2           Social History: Social History   Socioeconomic History   Marital status: Married    Spouse name: Not on file   Number of children: 2   Years of education: Not on file   Highest education level: Not on file  Occupational History   Not on file  Tobacco Use   Smoking status: Never   Smokeless tobacco: Never  Vaping Use   Vaping status: Never Used  Substance and Sexual Activity   Alcohol use: No   Drug use: No   Sexual activity: Yes    Birth control/protection: Condom  Other Topics Concern   Not on file  Social  History Narrative   Not on file   Social Drivers of Health   Financial Resource Strain: Not on file  Food Insecurity: No Food Insecurity (02/13/2024)   Hunger Vital Sign    Worried About Running Out of Food in the Last Year: Never true    Ran Out of Food in the Last Year: Never true  Transportation Needs: No Transportation Needs (02/13/2024)   PRAPARE - Administrator, Civil Service (Medical): No    Lack of Transportation (Non-Medical): No  Physical Activity: Not on file  Stress: Not on file  Social Connections: Not on file    Family History: Family History  Problem Relation Age of Onset   Healthy Mother    Healthy Father     Allergies: No Known Allergies  Medications Prior to Admission  Medication Sig Dispense Refill Last Dose/Taking   acetaminophen  (TYLENOL ) 325 MG tablet Take 2 tablets (650 mg total) by mouth every 6 (six) hours as needed (for pain scale < 4). 30 tablet 0 Past Week   ferrous sulfate  325 (65 FE) MG EC tablet Take 325 mg by mouth 3 (three) times daily with meals.   Past Week   Prenatal Vit-Fe Fumarate-FA (MULTIVITAMIN-PRENATAL) 27-0.8 MG TABS tablet Take 1  tablet by mouth daily at 12 noon.   02/12/2024   ibuprofen  (ADVIL ) 600 MG tablet Take 1 tablet (600 mg total) by mouth every 6 (six) hours as needed. 30 tablet 0 More than a month     Review of Systems  All systems reviewed and negative except as stated in HPI  Physical Exam BP 124/79   Pulse 78   Temp 98.5 F (36.9 C) (Oral)   Resp 15   Ht 5\' 6"  (1.676 m)   Wt 65.3 kg   LMP 02/28/2023 (Exact Date)   SpO2 97%   BMI 23.24 kg/m   Physical Exam Constitutional:      General: She is not in acute distress.    Appearance: She is not ill-appearing.  HENT:     Head: Normocephalic and atraumatic.     Right Ear: External ear normal.     Left Ear: External ear normal.     Nose: Nose normal.     Mouth/Throat:     Mouth: Mucous membranes are moist.     Pharynx: Oropharynx is clear.   Eyes:     Extraocular Movements: Extraocular movements intact.     Conjunctiva/sclera: Conjunctivae normal.  Cardiovascular:     Rate and Rhythm: Normal rate.  Pulmonary:     Effort: Pulmonary effort is normal. No respiratory distress.  Abdominal:     General: There is no distension.     Palpations: Abdomen is soft.     Tenderness: There is no abdominal tenderness.     Comments: Gravid  Genitourinary:    General: Normal vulva.     Vagina: No vaginal discharge.     Comments: CVE 3.5/50/-2 Musculoskeletal:        General: No swelling. Normal range of motion.     Cervical back: Normal range of motion.  Skin:    General: Skin is warm and dry.  Neurological:     General: No focal deficit present.     Motor: No weakness.     Gait: Gait normal.  Psychiatric:        Mood and Affect: Mood normal.        Behavior: Behavior normal.   Presentation: cephalic by BSUS  Fetal monitoring: Baseline: 140 bpm, Variability: Good {> 6 bpm), Accelerations: Reactive, and Decelerations: Absent Uterine activity: Irregular  Dilation: 3 Effacement (%): 50 Cervical Position: Posterior Station: -2 Presentation: Vertex Exam by:: Dr. Scherrie Curt  Prenatal labs: ABO, Rh: --/--/B POS (05/17 2033)B pos Antibody: NEG (05/17 2033)neg Rubella:  immune RPR:   NR HBsAg:   NR HIV:   NR GC/Chlamydia: Neg Neisseria Gonorrhea  Date Value Ref Range Status  10/28/2021 Negative  Final   Chlamydia  Date Value Ref Range Status  10/28/2021 Negative  Final   GBS:   Neg  Prenatal Transfer Tool  Maternal Diabetes: No Genetic Screening: Normal Maternal Ultrasounds/Referrals: Normal Fetal Ultrasounds or other Referrals:  None Maternal Substance Abuse:  No Significant Maternal Medications:  PO iron Significant Maternal Lab Results: Group B Strep negative  Results for orders placed or performed during the hospital encounter of 02/13/24 (from the past 24 hours)  POCT fern test   Collection Time: 02/13/24   7:34 PM  Result Value Ref Range   POCT Fern Test Positive = ruptured amniotic membanes   CBC   Collection Time: 02/13/24  8:32 PM  Result Value Ref Range   WBC 8.9 4.0 - 10.5 K/uL   RBC 4.24 3.87 - 5.11 MIL/uL   Hemoglobin  12.1 12.0 - 15.0 g/dL   HCT 08.6 57.8 - 46.9 %   MCV 88.2 80.0 - 100.0 fL   MCH 28.5 26.0 - 34.0 pg   MCHC 32.4 30.0 - 36.0 g/dL   RDW 62.9 52.8 - 41.3 %   Platelets 152 150 - 400 K/uL   nRBC 0.0 0.0 - 0.2 %  Type and screen [facility]   Collection Time: 02/13/24  8:33 PM  Result Value Ref Range   ABO/RH(D) B POS    Antibody Screen NEG    Sample Expiration      02/16/2024,2359 Performed at [facility], [address], [place], [place] [address]     Assessment: Emily Finley is a 28 y.o. G5P2012 at [redacted]w[redacted]d here for SROM ~1500 with clear fluid.  #Labor: Augmentation with pit #Pain: IV pain meds PRN, epidural upon request #FHT: Category I #GBS/ID: Negative #MOF: breast feeding #MOC: unsure  Ebony Goldstein, MD [facility] Fellow Center for [facility], [facility]  02/13/2024, 9:33 PM

## 2024-02-13 NOTE — MAU Note (Signed)
 Sheilah  Sherri Mcarthy is a 28 y.o. at [redacted]w[redacted]d here in MAU reporting: a gush of fluid at 1700, has not continued to leak. Denies pain or contractions. Reports positive fetal movement.   Onset of complaint: 1700 Pain score: 0/10 There were no vitals filed for this visit.   FHT:   Lab orders placed from triage: na

## 2024-02-14 DIAGNOSIS — O4202 Full-term premature rupture of membranes, onset of labor within 24 hours of rupture: Secondary | ICD-10-CM

## 2024-02-14 DIAGNOSIS — Z3A39 39 weeks gestation of pregnancy: Secondary | ICD-10-CM

## 2024-02-14 LAB — CBC
HCT: 32.8 % — ABNORMAL LOW (ref 36.0–46.0)
Hemoglobin: 11.2 g/dL — ABNORMAL LOW (ref 12.0–15.0)
MCH: 29 pg (ref 26.0–34.0)
MCHC: 34.1 g/dL (ref 30.0–36.0)
MCV: 85 fL (ref 80.0–100.0)
Platelets: 147 10*3/uL — ABNORMAL LOW (ref 150–400)
RBC: 3.86 MIL/uL — ABNORMAL LOW (ref 3.87–5.11)
RDW: 15 % (ref 11.5–15.5)
WBC: 14.9 10*3/uL — ABNORMAL HIGH (ref 4.0–10.5)
nRBC: 0 % (ref 0.0–0.2)

## 2024-02-14 LAB — RPR: RPR Ser Ql: NONREACTIVE

## 2024-02-14 MED ORDER — BENZOCAINE-MENTHOL 20-0.5 % EX AERO: 1.0000 | INHALATION_SPRAY | CUTANEOUS | Status: DC | PRN

## 2024-02-14 MED ORDER — DIPHENHYDRAMINE HCL 25 MG PO CAPS: 25.0000 mg | ORAL_CAPSULE | Freq: Four times a day (QID) | ORAL | Status: DC | PRN

## 2024-02-14 MED ORDER — SODIUM CHLORIDE 0.9% FLUSH: 3.0000 mL | INTRAVENOUS | Status: DC | PRN

## 2024-02-14 MED ORDER — SENNOSIDES-DOCUSATE SODIUM 8.6-50 MG PO TABS
2.0000 | ORAL_TABLET | ORAL | Status: DC
  Administered 2024-02-14 – 2024-02-15 (×2): 2 via ORAL
  Filled 2024-02-14: qty 2

## 2024-02-14 MED ORDER — COCONUT OIL OIL
1.0000 | TOPICAL_OIL | Status: DC | PRN
  Administered 2024-02-15: 1 via TOPICAL

## 2024-02-14 MED ORDER — SODIUM CHLORIDE 0.9 % IV SOLN: 250.0000 mL | INTRAVENOUS | Status: DC | PRN

## 2024-02-14 MED ORDER — WITCH HAZEL-GLYCERIN EX PADS: 1.0000 | MEDICATED_PAD | CUTANEOUS | Status: DC | PRN

## 2024-02-14 MED ORDER — ZOLPIDEM TARTRATE 5 MG PO TABS: 5.0000 mg | ORAL_TABLET | Freq: Every evening | ORAL | Status: DC | PRN

## 2024-02-14 MED ORDER — ONDANSETRON HCL 4 MG/2ML IJ SOLN: 4.0000 mg | INTRAMUSCULAR | Status: DC | PRN

## 2024-02-14 MED ORDER — SODIUM CHLORIDE 0.9% FLUSH: 3.0000 mL | Freq: Two times a day (BID) | INTRAVENOUS | Status: DC

## 2024-02-14 MED ORDER — IBUPROFEN 600 MG PO TABS
600.0000 mg | ORAL_TABLET | Freq: Four times a day (QID) | ORAL | Status: DC
  Administered 2024-02-14 – 2024-02-15 (×6): 600 mg via ORAL
  Filled 2024-02-14 (×6): qty 1

## 2024-02-14 MED ORDER — PRENATAL MULTIVITAMIN CH
1.0000 | ORAL_TABLET | Freq: Every day | ORAL | Status: DC
  Administered 2024-02-14 – 2024-02-15 (×2): 1 via ORAL
  Filled 2024-02-14 (×2): qty 1

## 2024-02-14 MED ORDER — ONDANSETRON HCL 4 MG PO TABS: 4.0000 mg | ORAL_TABLET | ORAL | Status: DC | PRN

## 2024-02-14 MED ORDER — SIMETHICONE 80 MG PO CHEW: 80.0000 mg | CHEWABLE_TABLET | ORAL | Status: DC | PRN

## 2024-02-14 MED ORDER — TETANUS-DIPHTH-ACELL PERTUSSIS 5-2.5-18.5 LF-MCG/0.5 IM SUSY: 0.5000 mL | PREFILLED_SYRINGE | Freq: Once | INTRAMUSCULAR | Status: DC

## 2024-02-14 MED ORDER — ACETAMINOPHEN 325 MG PO TABS: 650.0000 mg | ORAL_TABLET | ORAL | Status: DC | PRN

## 2024-02-14 MED ORDER — MEASLES, MUMPS & RUBELLA VAC IJ SOLR: 0.5000 mL | Freq: Once | INTRAMUSCULAR | Status: DC

## 2024-02-14 MED ORDER — DIBUCAINE (PERIANAL) 1 % EX OINT: 1.0000 | TOPICAL_OINTMENT | CUTANEOUS | Status: DC | PRN

## 2024-02-14 NOTE — Progress Notes (Signed)
 LABOR PROGRESS NOTE  Patient Name: Emily Finley, female   DOB: 25-Oct-1995, 28 y.o.  MRN: [id number]  Cat 1 strip, pit at 2 with great ctx pattern.  CVE per nursing 5/70/-2.  Continue with current plan, pit titration as needed.  Expectant SVD.   Ebony Goldstein, MD

## 2024-02-14 NOTE — Discharge Summary (Signed)
 Postpartum Discharge Summary      Patient Name: Emily Finley  Timiya Howells DOB: 1996-04-06 MRN: [id number]  Date of admission: 02/13/2024 Delivery date:02/14/2024 Delivering provider: CHUBB, CASEY C Date of discharge: 02/15/2024  Admitting diagnosis: Amniotic fluid leaking [O42.90] Intrauterine pregnancy: [redacted]w[redacted]d     Secondary diagnosis:  Principal Problem:   NSVD (normal spontaneous vaginal delivery) Active Problems:   Amniotic fluid leaking  Additional problems: None    Discharge diagnosis: Term Pregnancy Delivered                                              Post partum procedures:None Augmentation: AROM and Pitocin  Complications: None  Hospital course: Onset of Labor With Vaginal Delivery      28 y.o. yo [id number] at [redacted]w[redacted]d was admitted in Latent Labor on 02/13/2024. Labor course was uncomplicated. Membrane Rupture Time/Date: 5:00 PM,02/13/2024  Delivery Method:Vaginal, Spontaneous Operative Delivery:N/A Episiotomy: None Lacerations:  None Patient had a postpartum course uncomplicated.  She is ambulating, tolerating a regular diet, passing flatus, and urinating well. Patient is discharged home in stable condition on 02/15/24.  Newborn Data: Birth date:02/14/2024 Birth time:4:08 AM Gender:Female-Valeria Sophia Living status:Living Apgars:8 ,9  [id number] g  Magnesium Sulfate received: No BMZ received: No Rhophylac:N/A MMR:N/A T-DaP:Given prenatally Flu: N/A RSV Vaccine received: No Transfusion:No  Immunizations received: Immunization History  Administered Date(s) Administered   MMR 08/29/2019    Physical exam  Vitals:   02/13/24 1838 02/13/24 1840 02/13/24 2050  BP: 123/74  124/79  Pulse: 68  78  Resp: 15    Temp: 98.2 F (36.8 C)  98.5 F (36.9 C)  TempSrc: Oral  Oral  SpO2:  97%   Weight: 65.3 kg    Height: 5\' 6"  (1.676 m)     General: alert, cooperative, and no distress Lochia: appropriate Uterine Fundus: firm at U/-2 Incision: N/A DVT  Evaluation: No evidence of DVT seen on physical exam. No significant calf/ankle edema. Labs: Lab Results  Component Value Date   WBC 8.9 02/13/2024   HGB 12.1 02/13/2024   HCT 37.4 02/13/2024   MCV 88.2 02/13/2024   PLT 152 02/13/2024      Latest Ref Rng & Units 10/18/2021    3:23 PM  CMP  Glucose 70 - 99 mg/dL 96   BUN 6 - 20 mg/dL 12   Creatinine 0.27 - 1.00 mg/dL 2.53   Sodium 664 - 403 mmol/L 135   Potassium 3.5 - 5.1 mmol/L 3.7   Chloride 98 - 111 mmol/L 105   CO2 22 - 32 mmol/L 23   Calcium  8.9 - 10.3 mg/dL 9.1   Total Protein 6.5 - 8.1 g/dL 7.0   Total Bilirubin 0.3 - 1.2 mg/dL 0.4   Alkaline Phos 38 - 126 U/L 41   AST 15 - 41 U/L 16   ALT 0 - 44 U/L 12    Edinburgh Score:    08/29/2019    2:13 PM  Edinburgh Postnatal Depression Scale Screening Tool  I have been able to laugh and see the funny side of things. 0  I have looked forward with enjoyment to things. 0  I have blamed myself unnecessarily when things went wrong. 0  I have been anxious or worried for no good reason. 0  I have felt scared or panicky for no good reason. 0  Things have been getting on top  of me. 0  I have been so unhappy that I have had difficulty sleeping. 0  I have felt sad or miserable. 0  I have been so unhappy that I have been crying. 0  The thought of harming myself has occurred to me. 0  Edinburgh Postnatal Depression Scale Total 0   No data recorded  After visit meds:  Allergies as of 02/15/2024   No Known Allergies      Medication List     STOP taking these medications    ferrous sulfate  325 (65 FE) MG EC tablet       TAKE these medications    acetaminophen  325 MG tablet Commonly known as: Tylenol  Take 2 tablets (650 mg total) by mouth every 6 (six) hours as needed (for pain scale < 4).   ibuprofen  600 MG tablet Commonly known as: ADVIL  Take 1 tablet (600 mg total) by mouth every 6 (six) hours. What changed:  when to take this reasons to take this    multivitamin-prenatal 27-0.8 MG Tabs tablet Take 1 tablet by mouth daily at 12 noon.         Discharge home in stable condition Infant Feeding: Breast Infant Disposition:home with mother Discharge instruction: per After Visit Summary and Postpartum booklet. Activity: Advance as tolerated. Pelvic rest for 6 weeks.  Diet: routine diet Future Appointments:No future appointments. Follow up Visit: [facility]   Please schedule this patient for a In person postpartum visit in 4 weeks with the following provider: Any provider. Additional Postpartum F/U:n/a  Low risk pregnancy complicated by: n/a Delivery mode:  Vaginal, Spontaneous Anticipated Birth Control:  Unsure   02/14/2024 Ebony Goldstein, MD  Kraig Peru MSN, CNM Advanced Practice Provider, Center for [facility] 02/15/2024 7:15 AM

## 2024-02-14 NOTE — Progress Notes (Signed)
 Writer did 1st assessment on patient for lpn

## 2024-02-14 NOTE — Lactation Note (Signed)
 This note was copied from a baby's chart. Lactation Consultation Note  Patient Name: Emily Finley  Emily Finley Today's Date: 02/14/2024 Age:28 hours Reason for consult: Initial assessment;Term  Visited with family of 78 14/44 weeks old female "Emily Finley"; Ms. Wilfredo Hanly is a P3 and experienced breastfeeding. She voiced her plan is to do both, breast and formula feeding. Offered assistance with latch but she voiced that baby just fed; asked her to call for assistance when needed. Reviewed normal newborn behavior, feeding cues, cluster feeding, size of baby's stomach, pumping schedule, supplementation (in case she decides to supplement while at the hospital) and anticipatory guidelines.   Maternal Data Has patient been taught Hand Expression?: Yes Does the patient have breastfeeding experience prior to this delivery?: Yes How long did the patient breastfeed?: 6 months with baby # 1 and 3 months with baby # 2 (she had COVID)  Feeding Mother's Current Feeding Choice: Breast Milk  Lactation Tools Discussed/Used Tools: Pump;Flanges Flange Size: 18 Breast pump type: Manual Pump Education: Setup, frequency, and cleaning;Milk Storage Reason for Pumping: patient's request Pumping frequency: PRN or whenever baby is getting a bottle (she plans to start supplementing at the hospital)  Interventions Interventions: Breast feeding basics reviewed;Hand pump;Education;LC Services brochure  Plan Continue taking baby to breast +8 times/24 hours or sooner if feeding cues are present Pump whenever baby starts getting a bottle with EBM/formula Verify Stork pump issuance  No other support person at this time. All questions and concerns answered, family to contact [facility] services PRN.  Discharge Discharge Education: Engorgement and breast care Pump: Manual (Stork pump requested on 02/13/2024)  Consult Status Consult Status: Follow-up Date: 02/15/24 Follow-up type: In-patient   Kenneth Lax Newman Bare 02/14/2024, 2:23 PM

## 2024-02-14 NOTE — Plan of Care (Signed)
  Problem: Education: Goal: Knowledge of Childbirth will improve Outcome: Progressing Goal: Ability to make informed decisions regarding treatment and plan of care will improve Outcome: Progressing Goal: Ability to state and carry out methods to decrease the pain will improve Outcome: Progressing Goal: Individualized Educational Video(s) Outcome: Progressing   Problem: Coping: Goal: Ability to verbalize concerns and feelings about labor and delivery will improve Outcome: Progressing   Problem: Life Cycle: Goal: Ability to make normal progression through stages of labor will improve Outcome: Progressing Goal: Ability to effectively push during vaginal delivery will improve Outcome: Progressing   Problem: Role Relationship: Goal: Will demonstrate positive interactions with the child Outcome: Progressing   Problem: Safety: Goal: Risk of complications during labor and delivery will decrease Outcome: Progressing   Problem: Pain Management: Goal: Relief or control of pain from uterine contractions will improve Outcome: Progressing   Problem: Education: Goal: Knowledge of General Education information will improve Description: Including pain rating scale, medication(s)/side effects and non-pharmacologic comfort measures Outcome: Progressing   Problem: Health Behavior/Discharge Planning: Goal: Ability to manage health-related needs will improve Outcome: Progressing   Problem: Clinical Measurements: Goal: Ability to maintain clinical measurements within normal limits will improve Outcome: Progressing Goal: Will remain free from infection Outcome: Progressing Goal: Diagnostic test results will improve Outcome: Progressing Goal: Respiratory complications will improve Outcome: Progressing Goal: Cardiovascular complication will be avoided Outcome: Progressing   Problem: Activity: Goal: Risk for activity intolerance will decrease Outcome: Progressing   Problem:  Nutrition: Goal: Adequate nutrition will be maintained Outcome: Progressing   Problem: Coping: Goal: Level of anxiety will decrease Outcome: Progressing   Problem: Elimination: Goal: Will not experience complications related to bowel motility Outcome: Progressing Goal: Will not experience complications related to urinary retention Outcome: Progressing   Problem: Pain Managment: Goal: General experience of comfort will improve and/or be controlled Outcome: Progressing   Problem: Safety: Goal: Ability to remain free from injury will improve Outcome: Progressing   Problem: Skin Integrity: Goal: Risk for impaired skin integrity will decrease Outcome: Progressing   Problem: Education: Goal: Knowledge of condition will improve Outcome: Progressing Goal: Individualized Educational Video(s) Outcome: Progressing Goal: Individualized Newborn Educational Video(s) Outcome: Progressing   Problem: Activity: Goal: Will verbalize the importance of balancing activity with adequate rest periods Outcome: Progressing Goal: Ability to tolerate increased activity will improve Outcome: Progressing   Problem: Coping: Goal: Ability to identify and utilize available resources and services will improve Outcome: Progressing   Problem: Life Cycle: Goal: Chance of risk for complications during the postpartum period will decrease Outcome: Progressing   Problem: Role Relationship: Goal: Ability to demonstrate positive interaction with newborn will improve Outcome: Progressing   Problem: Skin Integrity: Goal: Demonstration of wound healing without infection will improve Outcome: Progressing   Problem: Education: Goal: Knowledge of condition will improve Outcome: Progressing Goal: Individualized Educational Video(s) Outcome: Progressing Goal: Individualized Newborn Educational Video(s) Outcome: Progressing   Problem: Activity: Goal: Will verbalize the importance of balancing activity  with adequate rest periods Outcome: Progressing Goal: Ability to tolerate increased activity will improve Outcome: Progressing   Problem: Coping: Goal: Ability to identify and utilize available resources and services will improve Outcome: Progressing   Problem: Life Cycle: Goal: Chance of risk for complications during the postpartum period will decrease Outcome: Progressing   Problem: Role Relationship: Goal: Ability to demonstrate positive interaction with newborn will improve Outcome: Progressing   Problem: Skin Integrity: Goal: Demonstration of wound healing without infection will improve Outcome: Progressing

## 2024-02-15 MED ORDER — IBUPROFEN 600 MG PO TABS: 600.0000 mg | ORAL_TABLET | Freq: Four times a day (QID) | ORAL | 0 refills | Status: AC

## 2024-02-15 NOTE — Progress Notes (Signed)
 Ordered pt meals, check on her needs, assisted Camilo Cella CNM with interpretation by Alexandra Ice Spanish Medical Interpreter.

## 2024-02-15 NOTE — Lactation Note (Signed)
 This note was copied from a baby's chart. Lactation Consultation Note  Patient Name: Emily Finley  [name] Today's Date: 02/15/2024 Age:28 hours Reason for consult: Follow-up assessment;Maternal discharge;Term  P3, 39 wks, @ 31 hrs of life. Video interpreter -[phone]- DC anticipated today. Mom has hand pump and Spectra  pump to take home. Encouraged mom to keep working on big mouth latch with baby and use EBM or coconut oil after each feed. Discussed cluster feeding overnight/ early morning brings in our milk supply, shared expectations of milk coming in. Highlighted risk of engorgement. Discussed hand pump/express to soften breasts, motrin  as anti-inflammatory, and ice packs for 10-20 minutes post feed/pumping if still over-full is the best treatments for inflamed/engorged breasts. Highlighted hand pump best to move milk from swollen/engorged breast.   Maternal Data Does the patient have breastfeeding experience prior to this delivery?: Yes How long did the patient breastfeed?: 6 months with baby # 1 and 3 months with baby # 2 (she had COVID)  Feeding Mother's Current Feeding Choice: Breast Milk Nipple Type: Slow - flow   Lactation Tools Discussed/Used Breast pump type: Double-Electric Breast Pump;Manual Pump Education: Milk Storage  Interventions Interventions: Hand express;Breast compression;Expressed milk;Coconut oil;Hand pump;DEBP;Education;LC Services brochure;CDC milk storage guidelines  Discharge Discharge Education: Engorgement and breast care Pump: DEBP;Manual;Received Stork Pump;Personal  Consult Status Consult Status: Complete Date: 02/15/24 Follow-up type: In-patient    [facility] 02/15/2024, 11:38 AM

## 2024-02-15 NOTE — Progress Notes (Addendum)
 POSTPARTUM PROGRESS NOTE  Post Partum Day 1  Subjective:  Emily  Janele Finley is a 28 y.o. [id number] s/p spontaneous vaginal delivery at [redacted]w[redacted]d.  She reports she is doing well. No acute events overnight. She denies any problems with ambulating, voiding or po intake. Denies nausea or vomiting.  Pain is well controlled.  Lochia is normal.  Objective: Blood pressure 93/61, pulse 81, temperature 98 F (36.7 C), temperature source Oral, resp. rate 17, height 5\' 6"  (1.676 m), weight 65.3 kg, last menstrual period 02/28/2023, SpO2 99%.  Physical Exam:  General: alert, cooperative and no distress Chest: no respiratory distress Heart: regular rate, distal pulses intact Extremities: trace edema Skin: warm, dry  Recent Labs    02/13/24 2032 02/14/24 0616  HGB 12.1 11.2*  HCT 37.4 32.8*    Assessment/Plan: Aki  Emily Finley is a 28 y.o. [id number] s/p spontaneous vaginal delivery at [redacted]w[redacted]d.   PPD#1 - Doing well  Routine postpartum care Contraception: Not interested at this time Feeding: Breast and formula Dispo: Plan for discharge on 05/19 or 05/20 if meeting all goals and patient's preference   LOS: 2 days   Park Bolk, Medical Student 02/15/2024, 6:15 AM

## 2024-02-25 ENCOUNTER — Telehealth (HOSPITAL_COMMUNITY): Payer: Self-pay | Admitting: *Deleted

## 2024-02-25 NOTE — Telephone Encounter (Signed)
 02/25/2024  Name: Assia Meanor MRN: [id number] DOB: 06-17-1996  Reason for Call:  Transition of Care Hospital Discharge Call  Contact Status: Patient Contact Status: Unable to contact ("voice mail box not set up yet", unable to leave a message)  Language assistant needed: Interpreter Mode: Telephonic Interpreter Interpreter Name: Bridgette Campus [id number]        Follow-Up Questions:    Dimple Francis Postnatal Depression Scale:  In the Past 7 Days:    PHQ2-9 Depression Scale:     Discharge Follow-up:    Post-discharge interventions: NA  Pearlie Bougie, RN 02/25/2024 12:16

## 2024-06-15 ENCOUNTER — Other Ambulatory Visit: Payer: Self-pay

## 2024-06-15 ENCOUNTER — Encounter: Payer: Self-pay | Admitting: Family Medicine

## 2024-06-15 ENCOUNTER — Ambulatory Visit: Admitting: Family Medicine

## 2024-06-15 VITALS — BP 97/61 | HR 73 | Wt 122.5 lb

## 2024-06-15 DIAGNOSIS — N6324 Unspecified lump in the left breast, lower inner quadrant: Secondary | ICD-10-CM | POA: Diagnosis not present

## 2024-06-15 NOTE — Progress Notes (Signed)
    Subjective:  Emily  Dariel Finley is a 28 y.o. female who presents to the clinic today for a left breast lump  HPI:  Patient presenting for lump in the left breast.  Reports that she was previously seen prior to her becoming pregnant for the left breast lump.  Had an ultrasound which showed a cyst.  It did not bother her through the pregnancy but she is breast-feeding and since starting breast-feeding and has gotten larger and more painful.  It also will turn red.  She started taking ibuprofen .  She says that it really got worse when she started taking something to help increase her milk supply.  Objective:  Physical Exam: BP 97/61   Pulse 73   Wt 122 lb 8 oz (55.6 kg)   LMP 05/13/2024 (Approximate)   Breastfeeding Yes   BMI 19.77 kg/m   Gen: Alert, well-appearing, no acute distress CV: Regular rate Pulm: Normal work of breathing Breast: Left breast lump, nonerythematous, 3 cm x 5 cm.  Also able to palpate milk duct.  Lactation specialist and chaperone present for entire exam  No results found for this or any previous visit (from the past 72 hours).   Assessment/Plan:  Mass of lower inner quadrant of left breast Previously identified cyst of the left breast.  Feel that it is likely enlarged due to current lactation state and may be occluding a lactation duct which is causing worsening pain.  Patient was supposed to have repeat ultrasound i 6 months after prior so we will go ahead and order ultrasound for further evaluation.  Discussed use of sunflower seed Lecithin.  Discussed continuing use of ibuprofen  for inflammation.   Lab Orders  No laboratory test(s) ordered today    No orders of the defined types were placed in this encounter.     Steffan Rover, MD Attending Family Medicine Physician, [facility] for [facility], [facility]   06/15/24 2:04 PM

## 2024-06-15 NOTE — Assessment & Plan Note (Signed)
 Previously identified cyst of the left breast.  Feel that it is likely enlarged due to current lactation state and may be occluding a lactation duct which is causing worsening pain.  Patient was supposed to have repeat ultrasound i 6 months after prior so we will go ahead and order ultrasound for further evaluation.  Discussed use of sunflower seed Lecithin.  Discussed continuing use of ibuprofen  for inflammation.

## 2024-06-21 ENCOUNTER — Encounter: Payer: Self-pay | Admitting: Family Medicine

## 2024-06-21 ENCOUNTER — Other Ambulatory Visit: Payer: Self-pay | Admitting: Family Medicine

## 2024-06-21 ENCOUNTER — Other Ambulatory Visit

## 2024-06-21 ENCOUNTER — Ambulatory Visit
Admission: RE | Admit: 2024-06-21 | Discharge: 2024-06-21 | Disposition: A | Discharge status: Home / Self Care | Source: Ambulatory Visit | Attending: Family Medicine

## 2024-06-21 ENCOUNTER — Ambulatory Visit
Admission: RE | Admit: 2024-06-21 | Discharge: 2024-06-21 | Disposition: A | Discharge status: Home / Self Care | Source: Ambulatory Visit | Attending: Family Medicine | Admitting: Family Medicine

## 2024-06-21 ENCOUNTER — Telehealth: Payer: Self-pay | Admitting: Family Medicine

## 2024-06-21 DIAGNOSIS — N6324 Unspecified lump in the left breast, lower inner quadrant: Secondary | ICD-10-CM

## 2024-06-21 DIAGNOSIS — N6314 Unspecified lump in the right breast, lower inner quadrant: Secondary | ICD-10-CM

## 2024-06-21 NOTE — Telephone Encounter (Signed)
 Received call from the breast center that this patient has an appt with their office tomorrow. They are needing a provider (Cresenzo) to co-sign the order in epic so that she can be seen tomorrow.

## 2024-06-22 ENCOUNTER — Other Ambulatory Visit

## 2024-06-22 ENCOUNTER — Inpatient Hospital Stay
Admission: RE | Admit: 2024-06-22 | Discharge: 2024-06-22 | Discharge status: Home / Self Care | Source: Ambulatory Visit | Attending: Family Medicine | Admitting: Family Medicine

## 2024-06-22 ENCOUNTER — Ambulatory Visit
Admission: RE | Admit: 2024-06-22 | Discharge: 2024-06-22 | Disposition: A | Discharge status: Home / Self Care | Source: Ambulatory Visit | Attending: Family Medicine | Admitting: Family Medicine

## 2024-06-22 ENCOUNTER — Inpatient Hospital Stay
Admission: RE | Admit: 2024-06-22 | Discharge: 2024-06-22 | Discharge status: Home / Self Care | Source: Ambulatory Visit | Attending: Family Medicine

## 2024-06-22 DIAGNOSIS — N6324 Unspecified lump in the left breast, lower inner quadrant: Secondary | ICD-10-CM

## 2024-06-22 HISTORY — PX: BREAST BIOPSY: SHX20

## 2024-06-22 NOTE — Telephone Encounter (Signed)
 Per chart review exams were completed this am. Rock Skip PEAK

## 2024-06-23 LAB — SURGICAL PATHOLOGY

## 2024-06-27 ENCOUNTER — Ambulatory Visit
Admission: RE | Admit: 2024-06-27 | Discharge: 2024-06-27 | Disposition: A | Discharge status: Home / Self Care | Source: Ambulatory Visit | Attending: Family Medicine | Admitting: Family Medicine

## 2024-06-27 ENCOUNTER — Other Ambulatory Visit: Payer: Self-pay | Admitting: Family Medicine

## 2024-06-27 DIAGNOSIS — N611 Abscess of the breast and nipple: Secondary | ICD-10-CM

## 2024-06-27 DIAGNOSIS — O91119 Abscess of breast associated with pregnancy, unspecified trimester: Secondary | ICD-10-CM

## 2024-07-01 ENCOUNTER — Ambulatory Visit
Admission: RE | Admit: 2024-07-01 | Discharge: 2024-07-01 | Disposition: A | Discharge status: Home / Self Care | Source: Ambulatory Visit | Attending: Family Medicine | Admitting: Family Medicine

## 2024-07-01 DIAGNOSIS — N611 Abscess of the breast and nipple: Secondary | ICD-10-CM
